# Patient Record
Sex: Female | Born: 1965 | Race: White | Hispanic: No | Smoking: Former smoker
Health system: Southern US, Community
[De-identification: ages and names within clinical notes are randomized; demographics above are authoritative.]

## PROBLEM LIST (undated history)

## (undated) DIAGNOSIS — N809 Endometriosis, unspecified: Secondary | ICD-10-CM

## (undated) DIAGNOSIS — T8859XA Other complications of anesthesia, initial encounter: Secondary | ICD-10-CM

## (undated) DIAGNOSIS — G43909 Migraine, unspecified, not intractable, without status migrainosus: Secondary | ICD-10-CM

## (undated) DIAGNOSIS — E2749 Other adrenocortical insufficiency: Secondary | ICD-10-CM

## (undated) DIAGNOSIS — Z87442 Personal history of urinary calculi: Secondary | ICD-10-CM

## (undated) DIAGNOSIS — E119 Type 2 diabetes mellitus without complications: Secondary | ICD-10-CM

## (undated) DIAGNOSIS — M792 Neuralgia and neuritis, unspecified: Secondary | ICD-10-CM

## (undated) DIAGNOSIS — T148XXA Other injury of unspecified body region, initial encounter: Secondary | ICD-10-CM

## (undated) DIAGNOSIS — E7800 Pure hypercholesterolemia, unspecified: Secondary | ICD-10-CM

## (undated) DIAGNOSIS — J189 Pneumonia, unspecified organism: Secondary | ICD-10-CM

## (undated) DIAGNOSIS — G5780 Other specified mononeuropathies of unspecified lower limb: Secondary | ICD-10-CM

## (undated) DIAGNOSIS — E785 Hyperlipidemia, unspecified: Secondary | ICD-10-CM

## (undated) DIAGNOSIS — I10 Essential (primary) hypertension: Secondary | ICD-10-CM

## (undated) DIAGNOSIS — R519 Headache, unspecified: Secondary | ICD-10-CM

## (undated) DIAGNOSIS — M5126 Other intervertebral disc displacement, lumbar region: Secondary | ICD-10-CM

## (undated) DIAGNOSIS — G4733 Obstructive sleep apnea (adult) (pediatric): Secondary | ICD-10-CM

## (undated) DIAGNOSIS — D869 Sarcoidosis, unspecified: Secondary | ICD-10-CM

## (undated) DIAGNOSIS — M1990 Unspecified osteoarthritis, unspecified site: Secondary | ICD-10-CM

## (undated) DIAGNOSIS — Z794 Long term (current) use of insulin: Secondary | ICD-10-CM

## (undated) DIAGNOSIS — Z7952 Long term (current) use of systemic steroids: Secondary | ICD-10-CM

## (undated) HISTORY — PX: NERVE EXPLORATION: SHX2082

## (undated) HISTORY — PX: CHOLECYSTECTOMY: SHX55

## (undated) HISTORY — PX: ABDOMINAL HYSTERECTOMY: SHX81

## (undated) HISTORY — PX: APPENDECTOMY: SHX54

## (undated) HISTORY — PX: CARDIAC CATHETERIZATION: SHX172

## (undated) HISTORY — PX: BACK SURGERY: SHX140

---

## 1991-08-21 HISTORY — PX: CYSTOSCOPY W/ URETERAL STENT PLACEMENT: SHX1429

## 1992-08-20 HISTORY — PX: APPENDECTOMY: SHX54

## 1994-08-20 HISTORY — PX: TOTAL ABDOMINAL HYSTERECTOMY: SHX209

## 2000-08-20 HISTORY — PX: INTRATHECAL PUMP IMPLANT: SHX6809

## 2004-05-30 ENCOUNTER — Ambulatory Visit: Payer: Self-pay | Admitting: Physician Assistant

## 2004-05-30 ENCOUNTER — Ambulatory Visit: Payer: Self-pay | Admitting: Pain Medicine

## 2004-06-02 ENCOUNTER — Ambulatory Visit: Payer: Self-pay | Admitting: Physician Assistant

## 2004-06-20 ENCOUNTER — Ambulatory Visit: Payer: Self-pay | Admitting: Physician Assistant

## 2004-07-04 ENCOUNTER — Ambulatory Visit: Payer: Self-pay | Admitting: Physician Assistant

## 2004-07-11 ENCOUNTER — Ambulatory Visit: Payer: Self-pay | Admitting: Physician Assistant

## 2004-07-17 ENCOUNTER — Ambulatory Visit: Payer: Self-pay | Admitting: Pain Medicine

## 2004-07-19 ENCOUNTER — Ambulatory Visit: Payer: Self-pay | Admitting: Physician Assistant

## 2004-09-26 ENCOUNTER — Ambulatory Visit: Payer: Self-pay | Admitting: Physician Assistant

## 2004-10-12 ENCOUNTER — Ambulatory Visit: Payer: Self-pay | Admitting: Physician Assistant

## 2004-11-10 ENCOUNTER — Ambulatory Visit: Payer: Self-pay | Admitting: Physician Assistant

## 2004-11-13 ENCOUNTER — Ambulatory Visit: Payer: Self-pay | Admitting: Physician Assistant

## 2004-11-21 ENCOUNTER — Ambulatory Visit: Payer: Self-pay | Admitting: Physician Assistant

## 2004-11-22 ENCOUNTER — Ambulatory Visit: Payer: Self-pay | Admitting: Physician Assistant

## 2004-11-27 ENCOUNTER — Ambulatory Visit: Payer: Self-pay | Admitting: Physician Assistant

## 2004-12-21 ENCOUNTER — Ambulatory Visit: Payer: Self-pay | Admitting: Physician Assistant

## 2005-01-18 ENCOUNTER — Ambulatory Visit: Payer: Self-pay | Admitting: Physician Assistant

## 2005-02-14 ENCOUNTER — Ambulatory Visit: Payer: Self-pay | Admitting: Physician Assistant

## 2005-03-19 ENCOUNTER — Ambulatory Visit: Payer: Self-pay | Admitting: Physician Assistant

## 2005-04-19 ENCOUNTER — Ambulatory Visit: Payer: Self-pay | Admitting: Physician Assistant

## 2005-05-10 ENCOUNTER — Ambulatory Visit: Payer: Self-pay | Admitting: Physician Assistant

## 2005-05-22 ENCOUNTER — Ambulatory Visit: Payer: Self-pay | Admitting: Physician Assistant

## 2005-05-24 ENCOUNTER — Ambulatory Visit: Payer: Self-pay | Admitting: Pain Medicine

## 2005-06-19 ENCOUNTER — Ambulatory Visit: Payer: Self-pay | Admitting: Physician Assistant

## 2005-07-10 ENCOUNTER — Ambulatory Visit: Payer: Self-pay | Admitting: Pain Medicine

## 2005-08-10 ENCOUNTER — Ambulatory Visit: Payer: Self-pay | Admitting: Physician Assistant

## 2005-09-13 ENCOUNTER — Ambulatory Visit: Payer: Self-pay | Admitting: Physician Assistant

## 2005-10-17 ENCOUNTER — Ambulatory Visit: Payer: Self-pay | Admitting: Physician Assistant

## 2005-10-19 ENCOUNTER — Ambulatory Visit: Payer: Self-pay | Admitting: Pain Medicine

## 2005-10-29 ENCOUNTER — Ambulatory Visit: Payer: Self-pay | Admitting: Physician Assistant

## 2005-12-24 ENCOUNTER — Ambulatory Visit: Payer: Self-pay | Admitting: Physician Assistant

## 2006-01-23 ENCOUNTER — Inpatient Hospital Stay: Payer: Self-pay | Admitting: Internal Medicine

## 2006-02-12 ENCOUNTER — Ambulatory Visit: Payer: Self-pay | Admitting: Physician Assistant

## 2006-03-06 ENCOUNTER — Ambulatory Visit: Payer: Self-pay | Admitting: Unknown Physician Specialty

## 2006-03-14 ENCOUNTER — Ambulatory Visit: Payer: Self-pay | Admitting: Unknown Physician Specialty

## 2006-04-10 ENCOUNTER — Ambulatory Visit: Payer: Self-pay | Admitting: Unknown Physician Specialty

## 2006-06-06 ENCOUNTER — Ambulatory Visit: Payer: Self-pay | Admitting: Physician Assistant

## 2006-07-16 ENCOUNTER — Ambulatory Visit: Payer: Self-pay | Admitting: Physician Assistant

## 2006-07-29 ENCOUNTER — Ambulatory Visit: Payer: Self-pay | Admitting: Physician Assistant

## 2006-09-12 ENCOUNTER — Ambulatory Visit: Payer: Self-pay | Admitting: Physician Assistant

## 2006-09-19 ENCOUNTER — Ambulatory Visit: Payer: Self-pay | Admitting: Pain Medicine

## 2006-10-08 ENCOUNTER — Ambulatory Visit: Payer: Self-pay | Admitting: Physician Assistant

## 2006-10-24 ENCOUNTER — Ambulatory Visit: Payer: Self-pay | Admitting: Physician Assistant

## 2006-10-31 ENCOUNTER — Ambulatory Visit: Payer: Self-pay | Admitting: Rheumatology

## 2006-11-26 ENCOUNTER — Ambulatory Visit: Payer: Self-pay | Admitting: Physician Assistant

## 2006-12-06 ENCOUNTER — Ambulatory Visit: Payer: Self-pay | Admitting: Rheumatology

## 2006-12-11 ENCOUNTER — Ambulatory Visit: Payer: Self-pay | Admitting: Physician Assistant

## 2006-12-14 ENCOUNTER — Other Ambulatory Visit: Payer: Self-pay

## 2006-12-14 ENCOUNTER — Inpatient Hospital Stay: Payer: Self-pay | Admitting: Internal Medicine

## 2006-12-23 ENCOUNTER — Ambulatory Visit: Payer: Self-pay | Admitting: Physician Assistant

## 2007-01-22 ENCOUNTER — Ambulatory Visit: Payer: Self-pay | Admitting: Physician Assistant

## 2007-01-24 ENCOUNTER — Other Ambulatory Visit: Payer: Self-pay

## 2007-01-24 ENCOUNTER — Ambulatory Visit: Payer: Self-pay | Admitting: General Surgery

## 2007-01-29 ENCOUNTER — Inpatient Hospital Stay: Payer: Self-pay | Admitting: General Surgery

## 2007-03-03 ENCOUNTER — Ambulatory Visit: Payer: Self-pay | Admitting: Physician Assistant

## 2007-03-31 ENCOUNTER — Ambulatory Visit: Payer: Self-pay | Admitting: Physician Assistant

## 2007-05-01 ENCOUNTER — Ambulatory Visit: Payer: Self-pay | Admitting: Physician Assistant

## 2007-05-26 ENCOUNTER — Ambulatory Visit: Payer: Self-pay | Admitting: Pain Medicine

## 2007-07-24 ENCOUNTER — Ambulatory Visit: Payer: Self-pay | Admitting: Internal Medicine

## 2007-07-29 ENCOUNTER — Ambulatory Visit: Payer: Self-pay | Admitting: Physician Assistant

## 2007-08-05 ENCOUNTER — Ambulatory Visit: Payer: Self-pay | Admitting: Physician Assistant

## 2007-08-28 ENCOUNTER — Ambulatory Visit: Payer: Self-pay | Admitting: Physician Assistant

## 2007-09-25 ENCOUNTER — Ambulatory Visit: Payer: Self-pay | Admitting: Physician Assistant

## 2007-09-30 ENCOUNTER — Ambulatory Visit: Payer: Self-pay | Admitting: Internal Medicine

## 2007-11-26 ENCOUNTER — Ambulatory Visit: Payer: Self-pay | Admitting: Physician Assistant

## 2007-12-02 ENCOUNTER — Other Ambulatory Visit: Payer: Self-pay

## 2007-12-02 ENCOUNTER — Inpatient Hospital Stay: Payer: Self-pay | Admitting: Internal Medicine

## 2007-12-23 ENCOUNTER — Emergency Department: Payer: Self-pay | Admitting: Emergency Medicine

## 2007-12-31 ENCOUNTER — Ambulatory Visit: Payer: Self-pay | Admitting: Physician Assistant

## 2008-01-14 ENCOUNTER — Ambulatory Visit: Payer: Self-pay | Admitting: Physician Assistant

## 2008-02-09 ENCOUNTER — Ambulatory Visit: Payer: Self-pay | Admitting: Pain Medicine

## 2008-02-12 ENCOUNTER — Other Ambulatory Visit: Payer: Self-pay

## 2008-02-12 ENCOUNTER — Inpatient Hospital Stay: Payer: Self-pay | Admitting: Specialist

## 2008-05-18 ENCOUNTER — Ambulatory Visit: Payer: Self-pay | Admitting: Physician Assistant

## 2008-07-21 ENCOUNTER — Inpatient Hospital Stay: Payer: Self-pay | Admitting: Internal Medicine

## 2008-09-04 ENCOUNTER — Inpatient Hospital Stay: Payer: Self-pay | Admitting: Internal Medicine

## 2008-10-07 ENCOUNTER — Ambulatory Visit: Payer: Self-pay | Admitting: Physician Assistant

## 2008-10-10 ENCOUNTER — Emergency Department: Payer: Self-pay | Admitting: Emergency Medicine

## 2009-01-27 ENCOUNTER — Ambulatory Visit: Payer: Self-pay | Admitting: Physician Assistant

## 2009-02-23 ENCOUNTER — Ambulatory Visit: Payer: Self-pay | Admitting: Physician Assistant

## 2009-06-16 ENCOUNTER — Ambulatory Visit: Payer: Self-pay | Admitting: Physician Assistant

## 2009-10-03 DIAGNOSIS — D869 Sarcoidosis, unspecified: Secondary | ICD-10-CM | POA: Insufficient documentation

## 2009-10-12 ENCOUNTER — Ambulatory Visit: Payer: Self-pay | Admitting: Pain Medicine

## 2009-12-07 IMAGING — US ABDOMEN ULTRASOUND
1 series · 17 of 25 positions shown · non-contrast
Comparison: none

REASON FOR EXAM: n/v/diarrhea, fatty liver/mild splenomegaly
COMMENTS:   LMP: Post Hysterectomy

[Series 1: abdomen ultrasound · 17 of 66 slices shown]
[im 1/66]
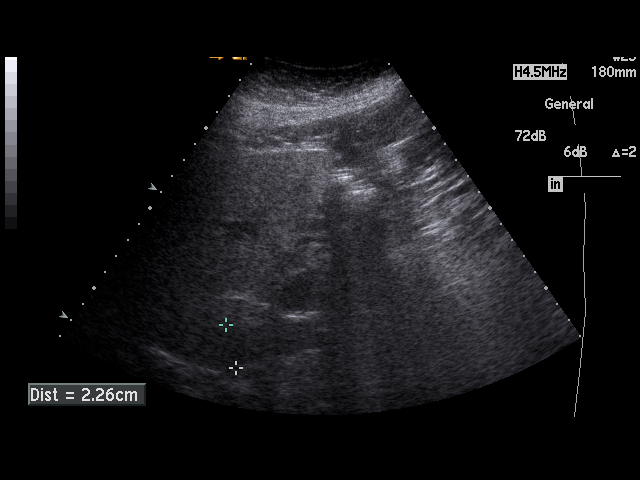
[im 6/66]
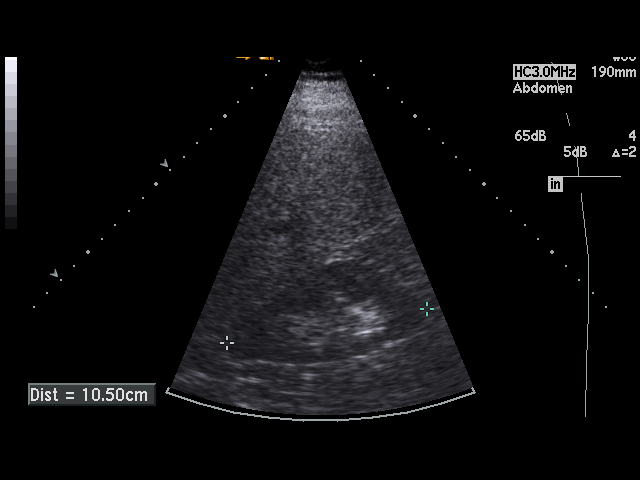
[im 9/66]
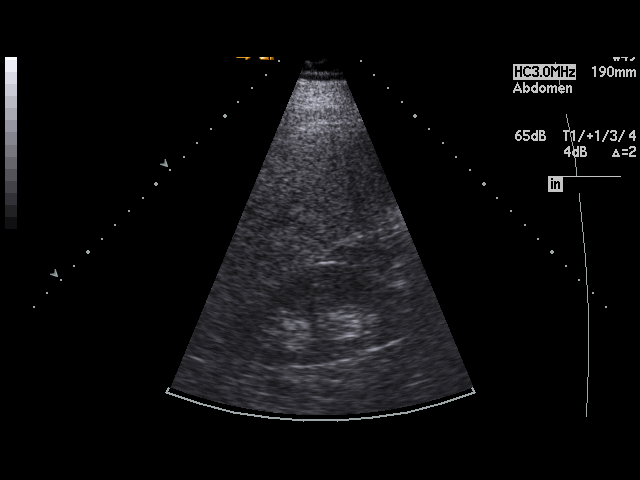
[im 14/66]
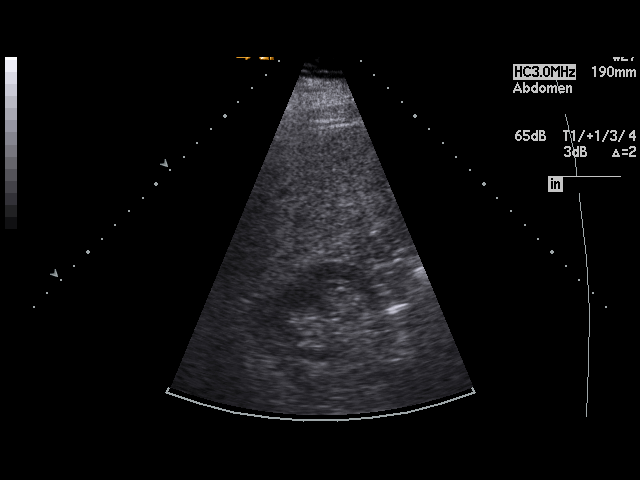
[im 17/66]
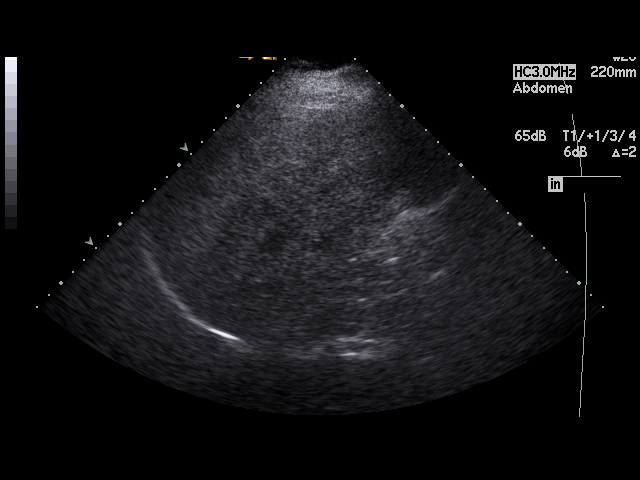
[im 22/66]
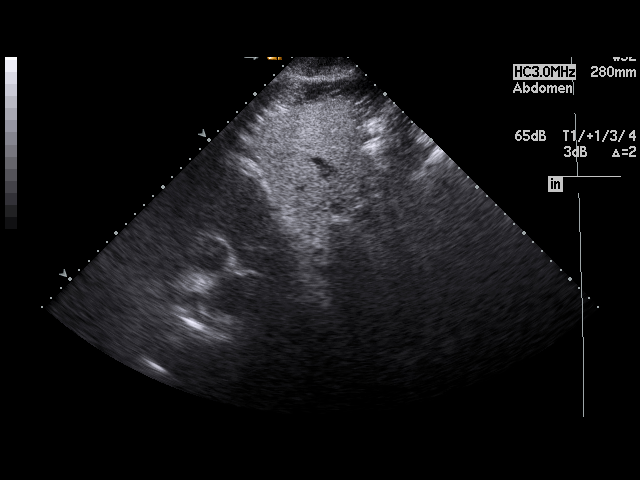
[im 25/66]
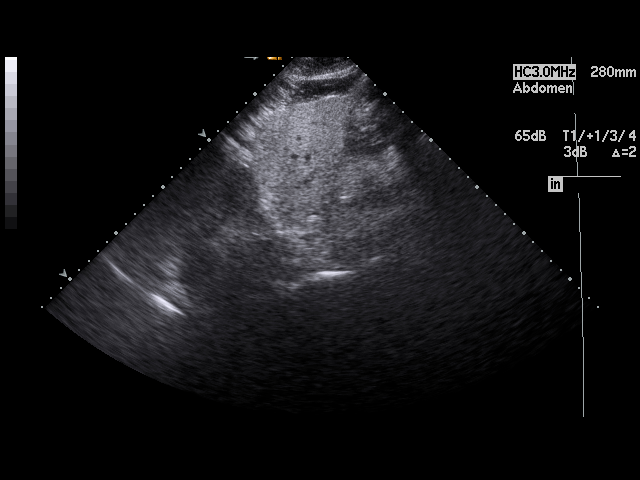
[im 30/66]
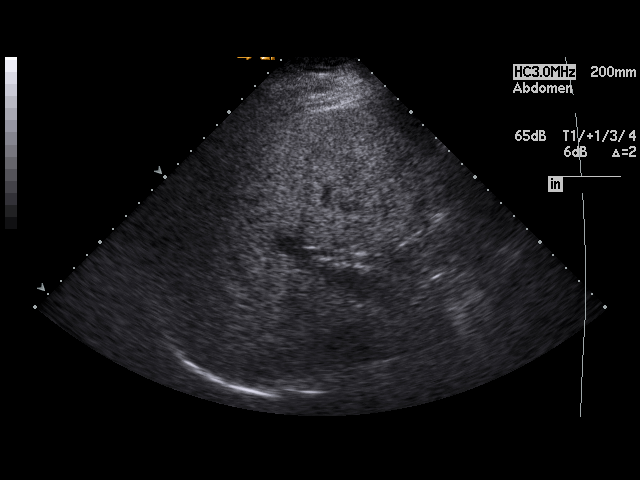
[im 33/66]
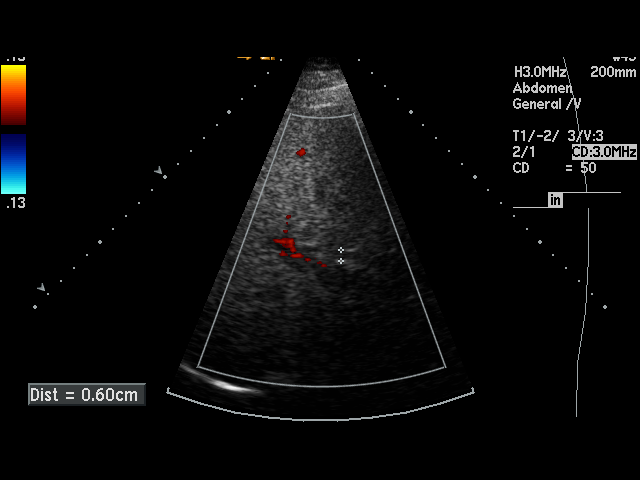
[im 36/66]
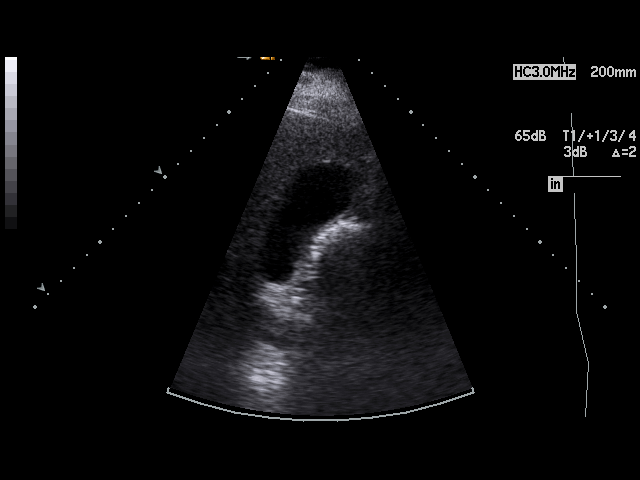
[im 41/66]
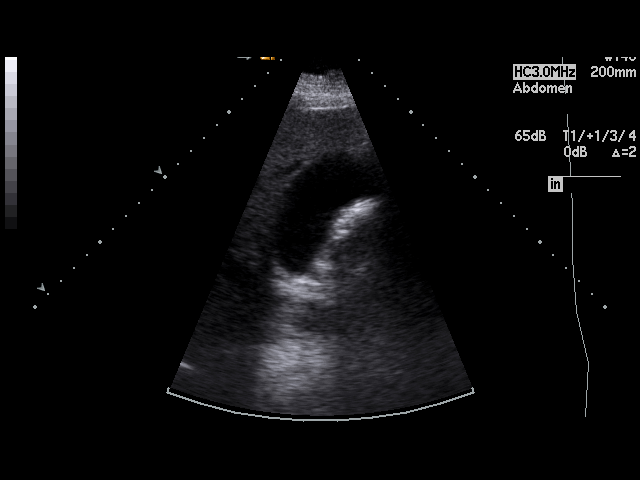
[im 44/66]
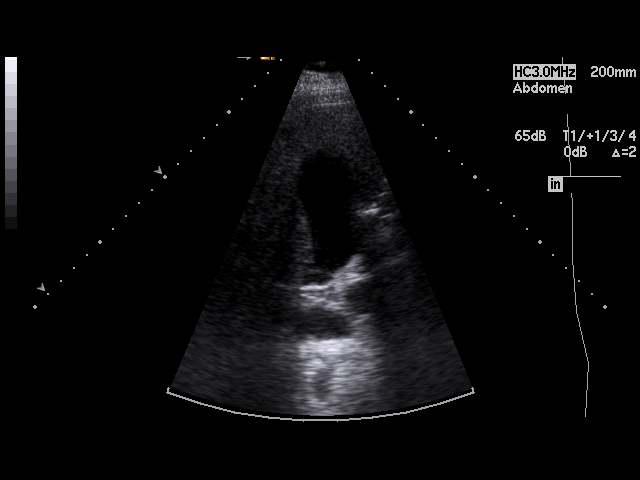
[im 49/66]
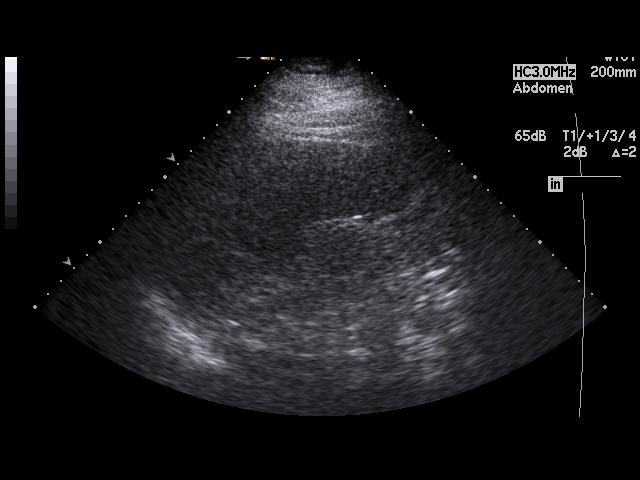
[im 52/66]
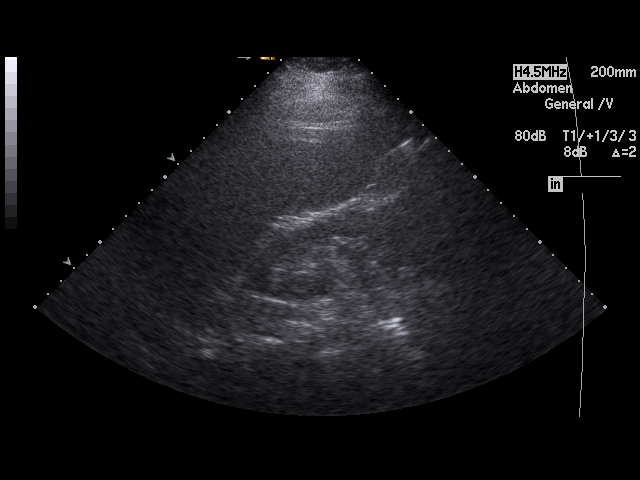
[im 57/66]
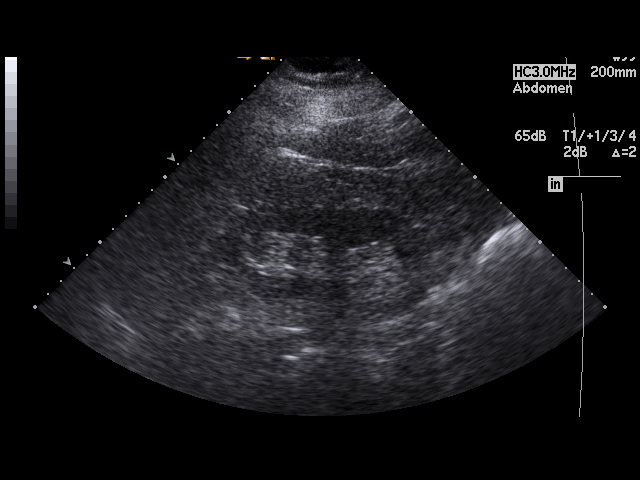
[im 60/66]
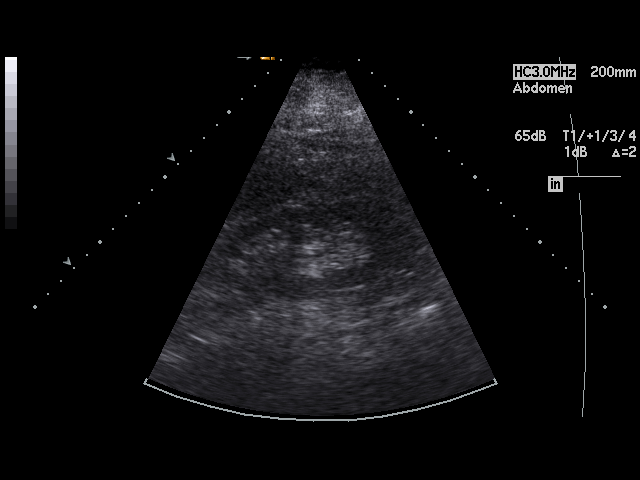
[im 66/66]
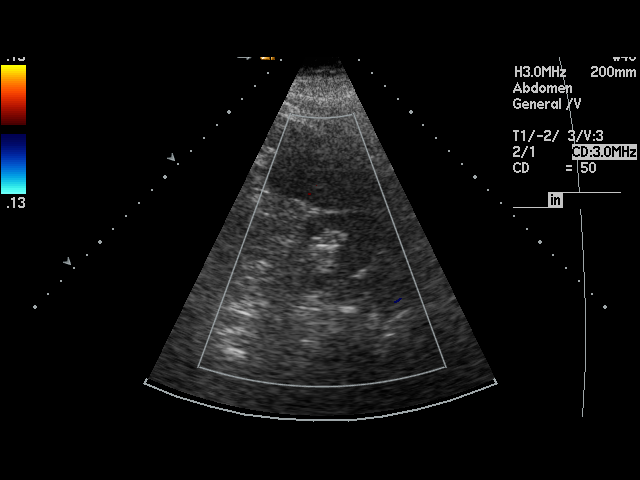

[17 of 25 positions shown; findings below may reference images not displayed]

PROCEDURE:     US  - US ABDOMEN GENERAL SURVEY  - February 13, 2008  [DATE]

RESULT:     The liver is dense, suspicious for fatty infiltration. No focal
hepatic mass lesions are seen. The pancreas is for the most part obscured by
bowel gas. The inferior vena cava and abdominal aorta also are not well seen
and are in great part obscured. The spleen is enlarged. The spleen measures
15.7 cm at maximum AP diameter. No gallstones are seen. There is no
thickening of the gallbladder wall. The common bile duct measures 4.6 mm in
diameter which is within normal limits. The kidneys show no hydronephrosis.
There is no ascites.
IMPRESSION: 1. No gallstones are seen.
2. Splenomegaly.
3. Probable fatty infiltration of the liver.
4. The pancreas is not visualized adequately for evaluation on this exam.

## 2010-01-26 ENCOUNTER — Ambulatory Visit: Payer: Self-pay | Admitting: Pain Medicine

## 2012-07-23 DIAGNOSIS — I8310 Varicose veins of unspecified lower extremity with inflammation: Secondary | ICD-10-CM | POA: Insufficient documentation

## 2013-01-14 DIAGNOSIS — M792 Neuralgia and neuritis, unspecified: Secondary | ICD-10-CM | POA: Insufficient documentation

## 2013-01-14 DIAGNOSIS — Z79891 Long term (current) use of opiate analgesic: Secondary | ICD-10-CM | POA: Insufficient documentation

## 2013-11-17 DIAGNOSIS — E2749 Other adrenocortical insufficiency: Secondary | ICD-10-CM | POA: Insufficient documentation

## 2013-12-15 HISTORY — PX: INTRATHECAL PUMP REVISION: SHX6810

## 2014-02-23 DIAGNOSIS — Z978 Presence of other specified devices: Secondary | ICD-10-CM | POA: Insufficient documentation

## 2015-03-16 DIAGNOSIS — G894 Chronic pain syndrome: Secondary | ICD-10-CM | POA: Insufficient documentation

## 2015-06-30 ENCOUNTER — Ambulatory Visit: Payer: Medicare Other | Admitting: Podiatry

## 2015-07-19 ENCOUNTER — Ambulatory Visit: Payer: Medicare Other | Admitting: Sports Medicine

## 2015-08-24 DIAGNOSIS — Z9049 Acquired absence of other specified parts of digestive tract: Secondary | ICD-10-CM | POA: Insufficient documentation

## 2015-08-24 HISTORY — PX: CHOLECYSTECTOMY, LAPAROSCOPIC: SHX56

## 2015-09-13 HISTORY — PX: COLONOSCOPY WITH ESOPHAGOGASTRODUODENOSCOPY (EGD): SHX5779

## 2015-09-18 DIAGNOSIS — K52831 Collagenous colitis: Secondary | ICD-10-CM | POA: Insufficient documentation

## 2016-05-22 DIAGNOSIS — K2980 Duodenitis without bleeding: Secondary | ICD-10-CM | POA: Insufficient documentation

## 2016-10-17 DIAGNOSIS — Z0289 Encounter for other administrative examinations: Secondary | ICD-10-CM | POA: Insufficient documentation

## 2018-07-15 ENCOUNTER — Emergency Department
Admission: EM | Admit: 2018-07-15 | Discharge: 2018-07-15 | Disposition: A | Discharge status: Home / Self Care | Payer: Medicare Other | Attending: Emergency Medicine | Admitting: Emergency Medicine

## 2018-07-15 ENCOUNTER — Other Ambulatory Visit: Payer: Self-pay

## 2018-07-15 ENCOUNTER — Encounter: Payer: Self-pay | Admitting: Emergency Medicine

## 2018-07-15 DIAGNOSIS — R05 Cough: Secondary | ICD-10-CM | POA: Diagnosis present

## 2018-07-15 DIAGNOSIS — Z7984 Long term (current) use of oral hypoglycemic drugs: Secondary | ICD-10-CM | POA: Insufficient documentation

## 2018-07-15 DIAGNOSIS — B9789 Other viral agents as the cause of diseases classified elsewhere: Secondary | ICD-10-CM | POA: Insufficient documentation

## 2018-07-15 DIAGNOSIS — E119 Type 2 diabetes mellitus without complications: Secondary | ICD-10-CM | POA: Insufficient documentation

## 2018-07-15 DIAGNOSIS — J069 Acute upper respiratory infection, unspecified: Secondary | ICD-10-CM | POA: Diagnosis not present

## 2018-07-15 DIAGNOSIS — Z79899 Other long term (current) drug therapy: Secondary | ICD-10-CM | POA: Diagnosis not present

## 2018-07-15 DIAGNOSIS — Z87891 Personal history of nicotine dependence: Secondary | ICD-10-CM | POA: Insufficient documentation

## 2018-07-15 DIAGNOSIS — I10 Essential (primary) hypertension: Secondary | ICD-10-CM | POA: Diagnosis not present

## 2018-07-15 HISTORY — DX: Type 2 diabetes mellitus without complications: E11.9

## 2018-07-15 HISTORY — DX: Pure hypercholesterolemia, unspecified: E78.00

## 2018-07-15 HISTORY — DX: Neuralgia and neuritis, unspecified: M79.2

## 2018-07-15 HISTORY — DX: Essential (primary) hypertension: I10

## 2018-07-15 MED ORDER — IPRATROPIUM-ALBUTEROL 0.5-2.5 (3) MG/3ML IN SOLN
RESPIRATORY_TRACT | Status: AC
  Administered 2018-07-15: 3 mL via RESPIRATORY_TRACT
  Filled 2018-07-15: qty 3

## 2018-07-15 MED ORDER — IPRATROPIUM-ALBUTEROL 0.5-2.5 (3) MG/3ML IN SOLN
3.0000 mL | Freq: Once | RESPIRATORY_TRACT | Status: AC
  Administered 2018-07-15: 3 mL via RESPIRATORY_TRACT

## 2018-07-15 MED ORDER — PSEUDOEPH-BROMPHEN-DM 30-2-10 MG/5ML PO SYRP: 5.0000 mL | ORAL_SOLUTION | Freq: Four times a day (QID) | ORAL | 0 refills | Status: DC | PRN

## 2018-07-15 NOTE — ED Triage Notes (Signed)
Presents with cough  States she developed cough several days ago  Eased up but returned yesterday   No fever

## 2018-07-15 NOTE — ED Notes (Addendum)
FIRST NURSE NOTE:  Pt states she is here with daughter, requesting to be seen for cough.  No distress noted on arrival.  Pt states the cough has been present for over 1 week.  Pt states cough got worse last night when sleeping.

## 2018-07-15 NOTE — Discharge Instructions (Addendum)
Follow-up with your primary care provider if any continued problems or [facility] acute [facility].  Discontinue taking Mucinex and begin taking Bromfed-DM as needed for cough and congestion.  Increase fluids.

## 2018-07-15 NOTE — ED Provider Notes (Signed)
[facility] Emergency Department Provider Note   ____________________________________________   First MD Initiated Contact with Patient 07/15/18 1226     (approximate)  I have reviewed the triage vital signs and the nursing notes.   HISTORY  Chief Complaint Cough   HPI Brittany Morris is a 52 y.o. female presents to the emergency department while her daughter is being seen for something unrelated with a cough for 1 to 2 weeks.  States that she is concerned because she has family members whose children have croup.  Patient states that she has been taking Mucinex DM for the cough with some improvement.  Patient is a former smoker but stopped 12 years ago.  She denies any difficulty breathing and currently is afebrile.   Past Medical History:  Diagnosis Date  . Diabetes mellitus without complication (HCC)   . High cholesterol   . Hypertension   . Nerve pain     There are no active problems to display for this patient.   Past Surgical History:  Procedure Laterality Date  . ABDOMINAL HYSTERECTOMY      Prior to Admission medications   Medication Sig Start Date End Date Taking? Authorizing Provider  BACLOFEN PO Take 10 mg by mouth.   Yes [provider]  budesonide (ENTOCORT EC) 3 MG 24 hr capsule Take 6 mg by mouth daily.   Yes [provider]  Dulaglutide (TRULICITY [place]) Inject into the skin.   Yes [provider]  furosemide (LASIX) 20 MG tablet Take 20 mg by mouth.   Yes [provider]  gabapentin (NEURONTIN) 300 MG capsule Take 900 mg by mouth 2 (two) times daily.   Yes [provider]  lisinopril (PRINIVIL,ZESTRIL) 10 MG tablet Take 10 mg by mouth daily.   Yes [provider]  pantoprazole (PROTONIX) 40 MG tablet Take 40 mg by mouth daily.   Yes [provider]  simvastatin (ZOCOR) 40 MG tablet Take 40 mg by mouth daily.   Yes [provider]    brompheniramine-pseudoephedrine-DM 30-2-10 MG/5ML syrup Take 5 mLs by mouth 4 (four) times daily as needed. 07/15/18   Tommi RumpsSummers, Zuha Dejonge L, PA-C    Allergies Ciprofloxacin  No family history on file.  Social History Social History   Tobacco Use  . Smoking status: Former Games developermoker  . Smokeless tobacco: Never Used  Substance Use Topics  . Alcohol use: Not Currently  . Drug use: Not on file    Review of Systems Constitutional: No fever/chills Eyes: No visual changes. ENT: No sore throat.  Negative for nasal congestion. Cardiovascular: Denies chest pain. Respiratory: Denies shortness of breath.  Positive cough. Gastrointestinal: No abdominal pain.  No nausea, no vomiting.  No diarrhea.  Genitourinary: Negative for dysuria. Musculoskeletal: Negative for muscle aches. Skin: Negative for rash. Neurological: Negative for headaches, focal weakness or numbness. ____________________________________________   PHYSICAL EXAM:  VITAL SIGNS: ED Triage Vitals  Enc Vitals Group     BP 07/15/18 1217 127/79     Pulse Rate 07/15/18 1217 88     Resp 07/15/18 1217 15     Temp 07/15/18 1217 98.2 F (36.8 C)     Temp Source 07/15/18 1217 Oral     SpO2 07/15/18 1217 99 %     Weight 07/15/18 1215 235 lb (106.6 kg)     Height 07/15/18 1215 5\' 6"  (1.676 m)     Head Circumference --      Peak Flow --  Pain Score 07/15/18 1215 0     Pain Loc --      Pain Edu? --      Excl. in GC? --    Constitutional: Alert and oriented. Well appearing and in no acute distress. Eyes: Conjunctivae are normal. PERRL. EOMI. Head: Atraumatic. Nose: No congestion/rhinnorhea.  EACs are clear.  TMs are dull but without erythema or injection. Mouth/Throat: Mucous membranes are moist.  Oropharynx non-erythematous.  Posterior drainage noted. Neck: No stridor.   Hematological/Lymphatic/Immunilogical: No cervical lymphadenopathy. Cardiovascular: Normal rate, regular rhythm. Grossly normal heart sounds.  Good  peripheral circulation. Respiratory: Normal respiratory effort.  No retractions. Lungs CTAB. Musculoskeletal: Moves upper and lower extremities without any difficulty normal gait was noted. Neurologic:  Normal speech and language. No gross focal neurologic deficits are appreciated. No gait instability. Skin:  Skin is warm, dry and intact. No rash noted. Psychiatric: Mood and affect are normal. Speech and behavior are normal.  ____________________________________________   LABS (all labs ordered are listed, but only abnormal results are displayed)  Labs Reviewed - No data to display  PROCEDURES  Procedure(s) performed: None  Procedures  Critical Care performed: No  ____________________________________________   INITIAL IMPRESSION / ASSESSMENT AND PLAN / ED COURSE  As part of my medical decision making, I reviewed the following data within the electronic MEDICAL RECORD NUMBER Notes from prior ED visits and [place] Controlled Substance Database  She presents to the ED with complaint of cough for 1 to 2 weeks.  Patient is a former smoker.  She denies any respiratory difficulties.  Exam was reassuring for a viral URI.  Patient was given a DuoNeb while in the emergency department which helped with her cough.  She states that she is used an inhaler in the past.  She was given a prescription for Bromfed-DM and told to discontinue taking the Mucinex at this time.  She is to follow-up with her PCP if any continued problems or [facility] acute care.  ____________________________________________   FINAL CLINICAL IMPRESSION(S) / ED DIAGNOSES  Final diagnoses:  Viral URI with cough     ED Discharge Orders         Ordered    brompheniramine-pseudoephedrine-DM 30-2-10 MG/5ML syrup  4 times daily PRN     07/15/18 1253           Note:  This document was prepared using Dragon voice recognition software and may include unintentional dictation errors.    Tommi Rumps, PA-C 07/15/18  1527    Governor Rooks, MD 07/15/18 [phone]

## 2019-09-24 DIAGNOSIS — M48062 Spinal stenosis, lumbar region with neurogenic claudication: Secondary | ICD-10-CM | POA: Insufficient documentation

## 2019-10-07 HISTORY — PX: INTRATHECAL PUMP REVISION: SHX6810

## 2019-10-07 HISTORY — PX: LUMBAR MICRODISCECTOMY: SHX99

## 2019-11-29 ENCOUNTER — Inpatient Hospital Stay
Admission: EM | Admit: 2019-11-29 | Discharge: 2019-12-03 | DRG: 177 | Disposition: A | Discharge status: Home Health | Payer: Medicare Other | Attending: Internal Medicine | Admitting: Internal Medicine

## 2019-11-29 ENCOUNTER — Encounter: Payer: Self-pay | Admitting: Emergency Medicine

## 2019-11-29 ENCOUNTER — Emergency Department: Payer: Medicare Other

## 2019-11-29 ENCOUNTER — Other Ambulatory Visit: Payer: Self-pay

## 2019-11-29 DIAGNOSIS — R531 Weakness: Secondary | ICD-10-CM | POA: Diagnosis present

## 2019-11-29 DIAGNOSIS — J1282 Pneumonia due to coronavirus disease 2019: Secondary | ICD-10-CM | POA: Diagnosis present

## 2019-11-29 DIAGNOSIS — E785 Hyperlipidemia, unspecified: Secondary | ICD-10-CM | POA: Diagnosis present

## 2019-11-29 DIAGNOSIS — E119 Type 2 diabetes mellitus without complications: Secondary | ICD-10-CM

## 2019-11-29 DIAGNOSIS — Z79899 Other long term (current) drug therapy: Secondary | ICD-10-CM | POA: Diagnosis not present

## 2019-11-29 DIAGNOSIS — F329 Major depressive disorder, single episode, unspecified: Secondary | ICD-10-CM | POA: Diagnosis present

## 2019-11-29 DIAGNOSIS — Z794 Long term (current) use of insulin: Secondary | ICD-10-CM | POA: Diagnosis not present

## 2019-11-29 DIAGNOSIS — Z8249 Family history of ischemic heart disease and other diseases of the circulatory system: Secondary | ICD-10-CM | POA: Diagnosis not present

## 2019-11-29 DIAGNOSIS — J9601 Acute respiratory failure with hypoxia: Secondary | ICD-10-CM | POA: Diagnosis present

## 2019-11-29 DIAGNOSIS — R197 Diarrhea, unspecified: Secondary | ICD-10-CM

## 2019-11-29 DIAGNOSIS — Z881 Allergy status to other antibiotic agents status: Secondary | ICD-10-CM

## 2019-11-29 DIAGNOSIS — Z87891 Personal history of nicotine dependence: Secondary | ICD-10-CM | POA: Diagnosis not present

## 2019-11-29 DIAGNOSIS — E871 Hypo-osmolality and hyponatremia: Secondary | ICD-10-CM | POA: Diagnosis present

## 2019-11-29 DIAGNOSIS — Z90710 Acquired absence of both cervix and uterus: Secondary | ICD-10-CM

## 2019-11-29 DIAGNOSIS — K219 Gastro-esophageal reflux disease without esophagitis: Secondary | ICD-10-CM | POA: Diagnosis present

## 2019-11-29 DIAGNOSIS — I10 Essential (primary) hypertension: Secondary | ICD-10-CM | POA: Diagnosis present

## 2019-11-29 DIAGNOSIS — E7800 Pure hypercholesterolemia, unspecified: Secondary | ICD-10-CM | POA: Diagnosis present

## 2019-11-29 DIAGNOSIS — E1165 Type 2 diabetes mellitus with hyperglycemia: Secondary | ICD-10-CM | POA: Diagnosis present

## 2019-11-29 DIAGNOSIS — R112 Nausea with vomiting, unspecified: Secondary | ICD-10-CM | POA: Diagnosis not present

## 2019-11-29 DIAGNOSIS — U071 COVID-19: Secondary | ICD-10-CM | POA: Diagnosis present

## 2019-11-29 LAB — HEPATITIS B SURFACE ANTIGEN: Hepatitis B Surface Ag: NONREACTIVE

## 2019-11-29 LAB — GLUCOSE, CAPILLARY
Glucose-Capillary: 301 mg/dL — ABNORMAL HIGH (ref 70–99)
Glucose-Capillary: 334 mg/dL — ABNORMAL HIGH (ref 70–99)
Glucose-Capillary: 447 mg/dL — ABNORMAL HIGH (ref 70–99)

## 2019-11-29 LAB — BASIC METABOLIC PANEL
Anion gap: 10 (ref 5–15)
Anion gap: 13 (ref 5–15)
BUN: 11 mg/dL (ref 6–20)
BUN: 14 mg/dL (ref 6–20)
CO2: 22 mmol/L (ref 22–32)
CO2: 19 mmol/L — ABNORMAL LOW (ref 22–32)
Calcium: 8.2 mg/dL — ABNORMAL LOW (ref 8.9–10.3)
Calcium: 8.4 mg/dL — ABNORMAL LOW (ref 8.9–10.3)
Chloride: 99 mmol/L (ref 98–111)
Chloride: 98 mmol/L (ref 98–111)
Creatinine, Ser: 0.72 mg/dL (ref 0.44–1.00)
Creatinine, Ser: 0.6 mg/dL (ref 0.44–1.00)
GFR calc Af Amer: >60 mL/min (ref >60)
GFR calc Af Amer: >60 mL/min (ref >60)
GFR calc non Af Amer: >60 mL/min (ref >60)
GFR calc non Af Amer: >60 mL/min (ref >60)
Glucose, Bld: 313 mg/dL — ABNORMAL HIGH (ref 70–99)
Glucose, Bld: 445 mg/dL — ABNORMAL HIGH (ref 70–99)
Potassium: 4 mmol/L (ref 3.5–5.1)
Potassium: 4.2 mmol/L (ref 3.5–5.1)
Sodium: 131 mmol/L — ABNORMAL LOW (ref 135–145)
Sodium: 130 mmol/L — ABNORMAL LOW (ref 135–145)

## 2019-11-29 LAB — CBC WITH DIFFERENTIAL/PLATELET
Abs Immature Granulocytes: 0.01 10*3/uL (ref 0.00–0.07)
Basophils Absolute: 0 10*3/uL (ref 0.0–0.1)
Basophils Relative: 0 %
Eosinophils Absolute: 0 10*3/uL (ref 0.0–0.5)
Eosinophils Relative: 0 %
HCT: 43.6 % (ref 36.0–46.0)
Hemoglobin: 14.3 g/dL (ref 12.0–15.0)
Immature Granulocytes: 0 %
Lymphocytes Relative: 11 %
Lymphs Abs: 0.5 10*3/uL — ABNORMAL LOW (ref 0.7–4.0)
MCH: 28.1 pg (ref 26.0–34.0)
MCHC: 32.8 g/dL (ref 30.0–36.0)
MCV: 85.8 fL (ref 80.0–100.0)
Monocytes Absolute: 0.3 10*3/uL (ref 0.1–1.0)
Monocytes Relative: 6 %
Neutro Abs: 3.7 10*3/uL (ref 1.7–7.7)
Neutrophils Relative %: 83 %
Platelets: 142 10*3/uL — ABNORMAL LOW (ref 150–400)
RBC: 5.08 MIL/uL (ref 3.87–5.11)
RDW: 12.9 % (ref 11.5–15.5)
WBC: 4.5 10*3/uL (ref 4.0–10.5)
nRBC: 0 % (ref 0.0–0.2)

## 2019-11-29 LAB — COMPREHENSIVE METABOLIC PANEL
ALT: 11 U/L (ref 0–44)
AST: 17 U/L (ref 15–41)
Albumin: 3.5 g/dL (ref 3.5–5.0)
Alkaline Phosphatase: 62 U/L (ref 38–126)
Anion gap: 13 (ref 5–15)
BUN: 12 mg/dL (ref 6–20)
CO2: 21 mmol/L — ABNORMAL LOW (ref 22–32)
Calcium: 8.3 mg/dL — ABNORMAL LOW (ref 8.9–10.3)
Chloride: 93 mmol/L — ABNORMAL LOW (ref 98–111)
Creatinine, Ser: 0.68 mg/dL (ref 0.44–1.00)
GFR calc Af Amer: >60 mL/min (ref >60)
GFR calc non Af Amer: >60 mL/min (ref >60)
Glucose, Bld: 326 mg/dL — ABNORMAL HIGH (ref 70–99)
Potassium: 3.9 mmol/L (ref 3.5–5.1)
Sodium: 127 mmol/L — ABNORMAL LOW (ref 135–145)
Total Bilirubin: 0.9 mg/dL (ref 0.3–1.2)
Total Protein: 7.1 g/dL (ref 6.5–8.1)

## 2019-11-29 LAB — POC SARS CORONAVIRUS 2 AG: SARS Coronavirus 2 Ag: POSITIVE — AB

## 2019-11-29 LAB — FERRITIN: Ferritin: 271 ng/mL (ref 11–307)

## 2019-11-29 LAB — TROPONIN I (HIGH SENSITIVITY)
Troponin I (High Sensitivity): 6 ng/L (ref <18)
Troponin I (High Sensitivity): 4 ng/L (ref <18)

## 2019-11-29 LAB — FIBRINOGEN: Fibrinogen: >750 mg/dL — ABNORMAL HIGH (ref 210–475)

## 2019-11-29 LAB — FIBRIN DERIVATIVES D-DIMER (ARMC ONLY): Fibrin derivatives D-dimer (ARMC): 1243.26 ng/mL (FEU) — ABNORMAL HIGH (ref 0.00–499.00)

## 2019-11-29 LAB — PROCALCITONIN: Procalcitonin: 0.11 ng/mL

## 2019-11-29 LAB — BRAIN NATRIURETIC PEPTIDE: B Natriuretic Peptide: 21 pg/mL (ref 0.0–100.0)

## 2019-11-29 LAB — OSMOLALITY, URINE: Osmolality, Ur: 337 mOsm/kg (ref 300–900)

## 2019-11-29 LAB — OSMOLALITY: Osmolality: 289 mOsm/kg (ref 275–295)

## 2019-11-29 LAB — LACTATE DEHYDROGENASE: LDH: 220 U/L — ABNORMAL HIGH (ref 98–192)

## 2019-11-29 LAB — HIV ANTIBODY (ROUTINE TESTING W REFLEX): HIV Screen 4th Generation wRfx: NONREACTIVE

## 2019-11-29 LAB — LACTIC ACID, PLASMA: Lactic Acid, Venous: 1.3 mmol/L (ref 0.5–1.9)

## 2019-11-29 LAB — SODIUM, URINE, RANDOM: Sodium, Ur: <10 mmol/L

## 2019-11-29 LAB — TRIGLYCERIDES: Triglycerides: 101 mg/dL (ref <150)

## 2019-11-29 LAB — C-REACTIVE PROTEIN: CRP: 11.3 mg/dL — ABNORMAL HIGH (ref <1.0)

## 2019-11-29 MED ORDER — OXYCODONE HCL 5 MG PO TABS: 5.0000 mg | ORAL_TABLET | Freq: Four times a day (QID) | ORAL | Status: DC | PRN

## 2019-11-29 MED ORDER — MORPHINE SULFATE 15 MG PO TABS
15.0000 mg | ORAL_TABLET | ORAL | Status: DC | PRN
  Administered 2019-11-29 – 2019-12-03 (×6): 15 mg via ORAL
  Filled 2019-11-29 (×6): qty 1

## 2019-11-29 MED ORDER — GABAPENTIN 300 MG PO CAPS
900.0000 mg | ORAL_CAPSULE | Freq: Two times a day (BID) | ORAL | Status: DC
  Administered 2019-11-29 – 2019-12-03 (×8): 900 mg via ORAL
  Filled 2019-11-29 (×8): qty 3

## 2019-11-29 MED ORDER — SODIUM CHLORIDE 0.9 % IV SOLN
100.0000 mg | Freq: Every day | INTRAVENOUS | Status: AC
  Administered 2019-11-30 – 2019-12-03 (×4): 100 mg via INTRAVENOUS
  Filled 2019-11-29: qty 100
  Filled 2019-11-29 (×2): qty 20
  Filled 2019-11-29: qty 100

## 2019-11-29 MED ORDER — INSULIN ASPART 100 UNIT/ML ~~LOC~~ SOLN
10.0000 [IU] | Freq: Once | SUBCUTANEOUS | Status: AC
  Administered 2019-11-29: 23:00:00 10 [IU] via SUBCUTANEOUS
  Filled 2019-11-29: qty 1

## 2019-11-29 MED ORDER — SODIUM CHLORIDE 0.9 % IV SOLN
200.0000 mg | Freq: Once | INTRAVENOUS | Status: AC
  Administered 2019-11-29: 14:00:00 200 mg via INTRAVENOUS
  Filled 2019-11-29: qty 40

## 2019-11-29 MED ORDER — ONDANSETRON HCL 4 MG/2ML IJ SOLN
4.0000 mg | Freq: Three times a day (TID) | INTRAMUSCULAR | Status: DC | PRN
  Administered 2019-11-29 – 2019-12-03 (×3): 4 mg via INTRAVENOUS
  Filled 2019-11-29 (×3): qty 2

## 2019-11-29 MED ORDER — DEXAMETHASONE SODIUM PHOSPHATE 10 MG/ML IJ SOLN
6.0000 mg | Freq: Once | INTRAMUSCULAR | Status: AC
  Administered 2019-11-29: 11:00:00 6 mg via INTRAVENOUS
  Filled 2019-11-29: qty 1

## 2019-11-29 MED ORDER — SODIUM CHLORIDE 0.9 % IV SOLN: INTRAVENOUS | Status: DC

## 2019-11-29 MED ORDER — SODIUM CHLORIDE 0.9 % IV BOLUS
500.0000 mL | Freq: Once | INTRAVENOUS | Status: AC
  Administered 2019-11-29: 10:00:00 500 mL via INTRAVENOUS

## 2019-11-29 MED ORDER — SIMVASTATIN 20 MG PO TABS
40.0000 mg | ORAL_TABLET | Freq: Every day | ORAL | Status: DC
  Administered 2019-11-29 – 2019-12-03 (×5): 40 mg via ORAL
  Filled 2019-11-29 (×5): qty 2

## 2019-11-29 MED ORDER — LISINOPRIL 10 MG PO TABS
10.0000 mg | ORAL_TABLET | Freq: Every day | ORAL | Status: DC
  Administered 2019-11-29 – 2019-11-30 (×2): 10 mg via ORAL
  Filled 2019-11-29 (×2): qty 1

## 2019-11-29 MED ORDER — TIZANIDINE HCL 4 MG PO TABS
4.0000 mg | ORAL_TABLET | Freq: Three times a day (TID) | ORAL | Status: DC
  Administered 2019-11-29 – 2019-12-03 (×10): 4 mg via ORAL
  Filled 2019-11-29 (×13): qty 1

## 2019-11-29 MED ORDER — PANTOPRAZOLE SODIUM 40 MG PO TBEC
40.0000 mg | DELAYED_RELEASE_TABLET | Freq: Every day | ORAL | Status: DC
  Administered 2019-11-29 – 2019-12-03 (×5): 40 mg via ORAL
  Filled 2019-11-29 (×5): qty 1

## 2019-11-29 MED ORDER — ASCORBIC ACID 500 MG PO TABS
500.0000 mg | ORAL_TABLET | Freq: Every day | ORAL | Status: DC
  Administered 2019-11-29 – 2019-12-03 (×5): 500 mg via ORAL
  Filled 2019-11-29 (×5): qty 1

## 2019-11-29 MED ORDER — ENOXAPARIN SODIUM 40 MG/0.4ML ~~LOC~~ SOLN
40.0000 mg | Freq: Two times a day (BID) | SUBCUTANEOUS | Status: DC
  Administered 2019-11-29 – 2019-12-03 (×7): 40 mg via SUBCUTANEOUS
  Filled 2019-11-29 (×8): qty 0.4

## 2019-11-29 MED ORDER — INSULIN ASPART 100 UNIT/ML ~~LOC~~ SOLN
6.0000 [IU] | Freq: Once | SUBCUTANEOUS | Status: AC
  Administered 2019-11-29: 11:00:00 6 [IU] via INTRAVENOUS
  Filled 2019-11-29: qty 1

## 2019-11-29 MED ORDER — HYDRALAZINE HCL 20 MG/ML IJ SOLN
5.0000 mg | INTRAMUSCULAR | Status: DC | PRN
  Administered 2019-12-01: 5 mg via INTRAVENOUS
  Filled 2019-11-29: qty 1

## 2019-11-29 MED ORDER — IPRATROPIUM BROMIDE HFA 17 MCG/ACT IN AERS
2.0000 | INHALATION_SPRAY | RESPIRATORY_TRACT | Status: DC
  Administered 2019-11-29 – 2019-12-03 (×22): 2 via RESPIRATORY_TRACT
  Filled 2019-11-29 (×2): qty 12.9

## 2019-11-29 MED ORDER — ONDANSETRON HCL 4 MG/2ML IJ SOLN
4.0000 mg | Freq: Once | INTRAMUSCULAR | Status: AC
  Administered 2019-11-29: 10:00:00 4 mg via INTRAVENOUS
  Filled 2019-11-29: qty 2

## 2019-11-29 MED ORDER — DULOXETINE HCL 30 MG PO CPEP
60.0000 mg | ORAL_CAPSULE | Freq: Two times a day (BID) | ORAL | Status: DC
  Administered 2019-11-29 – 2019-12-03 (×8): 60 mg via ORAL
  Filled 2019-11-29 (×8): qty 2

## 2019-11-29 MED ORDER — INSULIN ASPART 100 UNIT/ML ~~LOC~~ SOLN
0.0000 [IU] | Freq: Three times a day (TID) | SUBCUTANEOUS | Status: DC
  Administered 2019-11-29 (×2): 7 [IU] via SUBCUTANEOUS
  Administered 2019-11-30 (×2): 9 [IU] via SUBCUTANEOUS
  Filled 2019-11-29 (×3): qty 1

## 2019-11-29 MED ORDER — ALBUTEROL SULFATE HFA 108 (90 BASE) MCG/ACT IN AERS
2.0000 | INHALATION_SPRAY | RESPIRATORY_TRACT | Status: DC | PRN
  Filled 2019-11-29: qty 6.7

## 2019-11-29 MED ORDER — DM-GUAIFENESIN ER 30-600 MG PO TB12
1.0000 | ORAL_TABLET | Freq: Two times a day (BID) | ORAL | Status: DC
  Administered 2019-11-29 – 2019-12-03 (×9): 1 via ORAL
  Filled 2019-11-29 (×9): qty 1

## 2019-11-29 MED ORDER — ZINC SULFATE 220 (50 ZN) MG PO CAPS
220.0000 mg | ORAL_CAPSULE | Freq: Every day | ORAL | Status: DC
  Administered 2019-11-29 – 2019-12-03 (×5): 220 mg via ORAL
  Filled 2019-11-29 (×5): qty 1

## 2019-11-29 MED ORDER — ACETAMINOPHEN 325 MG PO TABS: 650.0000 mg | ORAL_TABLET | Freq: Four times a day (QID) | ORAL | Status: DC | PRN

## 2019-11-29 MED ORDER — METHYLPREDNISOLONE SODIUM SUCC 125 MG IJ SOLR
60.0000 mg | Freq: Two times a day (BID) | INTRAMUSCULAR | Status: DC
  Administered 2019-11-29 – 2019-12-01 (×4): 60 mg via INTRAVENOUS
  Filled 2019-11-29 (×4): qty 2

## 2019-11-29 MED ORDER — INSULIN ASPART 100 UNIT/ML ~~LOC~~ SOLN
0.0000 [IU] | Freq: Every day | SUBCUTANEOUS | Status: DC
  Filled 2019-11-29: qty 1

## 2019-11-29 MED ORDER — ACETAMINOPHEN 500 MG PO TABS
1000.0000 mg | ORAL_TABLET | Freq: Once | ORAL | Status: AC
  Administered 2019-11-29: 1000 mg via ORAL
  Filled 2019-11-29: qty 2

## 2019-11-29 MED ORDER — ZONISAMIDE 100 MG PO CAPS
100.0000 mg | ORAL_CAPSULE | Freq: Every day | ORAL | Status: DC
  Administered 2019-11-29 – 2019-12-03 (×5): 100 mg via ORAL
  Filled 2019-11-29 (×5): qty 1

## 2019-11-29 NOTE — ED Notes (Signed)
Pt arrived ant 88% on RA. Pt placed on 2L [place] on arrival. Pt O2 decreased to 89% on 2L [place] and flow rate increased to 3L [place]. O2 currently 94%.

## 2019-11-29 NOTE — Consult Note (Signed)
Remdesivir - Pharmacy Brief Note   No hx of remdesivir previous administration found.  A/P:  Remdesivir 200 mg IVPB once followed by 100 mg IVPB daily x 4 days.    Albina Billet, PharmD, BCPS Clinical Pharmacist 11/29/2019 11:24 AM

## 2019-11-29 NOTE — Progress Notes (Signed)
PHARMACIST - PHYSICIAN COMMUNICATION  CONCERNING:  Enoxaparin (Lovenox) for DVT Prophylaxis    RECOMMENDATION: Patient was prescribed enoxaprin 40mg  q24 hours for VTE prophylaxis.   Filed Weights   11/29/19 0930  Weight: 104.3 kg (230 lb)    Body mass index is 40.74 kg/m.  Estimated Creatinine Clearance: 95.1 mL/min (by C-G formula based on SCr of 0.72 mg/dL).   Based on [facility] policy patient is candidate for enoxaparin 40mg  every 12 hour dosing due to BMI being >40.  DESCRIPTION: Pharmacy has adjusted enoxaparin dose per [facility] policy.  Patient is now receiving enoxaparin 40mg  every 12 hours.    Brittany Morris, PharmD Clinical Pharmacist  11/29/2019 3:17 PM

## 2019-11-29 NOTE — H&P (Signed)
History and Physical    Brittany Morris NFA:[id number] DOB: January 28, 1966 DOA: 11/29/2019  Referring MD/NP/PA:   PCP: System, Pcp Not In   Patient coming from:  The patient is coming from home.  At baseline, pt is independent for most of ADL.        Chief Complaint: Generalized weakness, shortness breath, nausea, vomiting, diarrhea  HPI: Brittany Morris is a 54 y.o. female with medical history significant of hypertension, hyperlipidemia, diabetes mellitus, GERD, who presents with generalized weakness, shortness breath, nausea, vomiting, diarrhea.  Patient states that she had a positive COVID-19 test on Thursday, and has not been feeling well in the past several days.  She has mild shortness breath and mild cough. No CP. She has fever with temperature 100.1 in ED. She has generalized weakness, poor appetite, nausea, vomiting and diarrhea. No abdominal pain.  No symptoms of UTI or unilateral weakness. Oxygen desaturation to 85% on room air, which improved to 94% on 2 L nasal cannula oxygen.  ED Course: pt was found to have positive COVID-19 Ag test, WBC 4.5, lactic acid 1.3, sodium 127, renal function okay, temperature 100.1, blood pressure 142/86, tachycardia, RR 18, chest x-ray showed patchy peripheral infiltration.  Patient is admitted to MedSurg bed as inpatient.  Review of Systems:   General: has fevers, chills, no body weight gain, has poor appetite, has fatigue HEENT: no blurry vision, hearing changes or sore throat Respiratory: has dyspnea, coughing, no wheezing CV: no chest pain, no palpitations GI: has nausea, vomiting, diarrhea, no constipation, abdominal pain, GU: no dysuria, burning on urination, increased urinary frequency, hematuria  Ext: no leg edema Neuro: no unilateral weakness, numbness, or tingling, no vision change or hearing loss Skin: no rash, no skin tear. MSK: No muscle spasm, no deformity, no limitation of range of movement in spin Heme: No easy bruising.  Travel  history: No recent long distant travel.  Allergy:  Allergies  Allergen Reactions  . Ciprofloxacin Rash    Past Medical History:  Diagnosis Date  . Diabetes mellitus without complication (HCC)   . High cholesterol   . Hypertension   . Nerve pain     Past Surgical History:  Procedure Laterality Date  . ABDOMINAL HYSTERECTOMY      Social History:  reports that she has quit smoking. She has never used smokeless tobacco. She reports previous alcohol use. She reports that she does not use drugs.  Family History:  Family History  Problem Relation Age of Onset  . Heart disease Mother      Prior to Admission medications   Medication Sig Start Date End Date Taking? Authorizing Provider  BACLOFEN PO Take 10 mg by mouth.    [provider]  brompheniramine-pseudoephedrine-DM 30-2-10 MG/5ML syrup Take 5 mLs by mouth 4 (four) times daily as needed. 07/15/18   Tommi Rumps, PA-C  budesonide (ENTOCORT EC) 3 MG 24 hr capsule Take 6 mg by mouth daily.    [provider]  Dulaglutide (TRULICITY [place]) Inject into the skin.    [provider]  furosemide (LASIX) 20 MG tablet Take 20 mg by mouth.    [provider]  gabapentin (NEURONTIN) 300 MG capsule Take 900 mg by mouth 2 (two) times daily.    [provider]  lisinopril (PRINIVIL,ZESTRIL) 10 MG tablet Take 10 mg by mouth daily.    [provider]  pantoprazole (PROTONIX) 40 MG tablet Take 40 mg by mouth daily.    [provider]  simvastatin (ZOCOR) 40 MG tablet Take 40 mg by mouth daily.    [provider]    Physical Exam: Vitals:   11/29/19 1132 11/29/19 1230 11/29/19 1300 11/29/19 1330  BP:  (!) 140/95 (!) 148/93 (!) 131/91  Pulse: 96 95 88 80  Resp: 15 18 16    Temp:      TempSrc:      SpO2: 95% 93% 96% 95%  Weight:      Height:       General: Not in acute distress. Dry mucus and lumbar HEENT:       Eyes: PERRL, EOMI, no scleral icterus.       ENT:  No discharge from the ears and nose, no pharynx injection, no tonsillar enlargement.        Neck: No JVD, no bruit, no mass felt. Heme: No neck lymph node enlargement. Cardiac: S1/S2, RRR, No murmurs, No gallops or rubs. Respiratory: No rales, wheezing, rhonchi or rubs. GI: Soft, nondistended, nontender, no rebound pain, no organomegaly, BS present. GU: No hematuria Ext: No pitting leg edema bilaterally. 2+DP/PT pulse bilaterally. Musculoskeletal: No joint deformities, No joint redness or warmth, no limitation of ROM in spin. Skin: No rashes.  Neuro: Alert, oriented X3, cranial nerves II-XII grossly intact, moves all extremities normally.  Psych: Patient is not psychotic, no suicidal or hemocidal ideation.  Labs on Admission: I have personally reviewed following labs and imaging studies  CBC: Recent Labs  Lab 11/29/19 0936  WBC 4.5  NEUTROABS 3.7  HGB 14.3  HCT 43.6  MCV 85.8  PLT 299*   Basic Metabolic Panel: Recent Labs  Lab 11/29/19 0936 11/29/19 1251  NA 127* 131*  K 3.9 4.0  CL 93* 99  CO2 21* 22  GLUCOSE 326* 313*  BUN 12 11  CREATININE 0.68 0.72  CALCIUM 8.3* 8.2*   GFR: Estimated Creatinine Clearance: 95.1 mL/min (by C-G formula based on SCr of 0.72 mg/dL). Liver Function Tests: Recent Labs  Lab 11/29/19 0936  AST 17  ALT 11  ALKPHOS 62  BILITOT 0.9  PROT 7.1  ALBUMIN 3.5   No results for input(s): LIPASE, AMYLASE in the last 168 hours. No results for input(s): AMMONIA in the last 168 hours. Coagulation Profile: No results for input(s): INR, PROTIME in the last 168 hours. Cardiac Enzymes: No results for input(s): CKTOTAL, CKMB, CKMBINDEX, TROPONINI in the last 168 hours. BNP (last 3 results) No results for input(s): PROBNP in the last 8760 hours. HbA1C: No results for input(s): HGBA1C in the last 72 hours. CBG: Recent Labs  Lab 11/29/19 1334  GLUCAP 301*   Lipid Profile: Recent Labs    11/29/19 0934  TRIG 101   Thyroid Function  Tests: No results for input(s): TSH, T4TOTAL, FREET4, T3FREE, THYROIDAB in the last 72 hours. Anemia Panel: Recent Labs    11/29/19 0936  FERRITIN 271   Urine analysis: No results found for: COLORURINE, APPEARANCEUR, LABSPEC, PHURINE, GLUCOSEU, HGBUR, BILIRUBINUR, KETONESUR, PROTEINUR, UROBILINOGEN, NITRITE, LEUKOCYTESUR Sepsis Labs: @LABRCNTIP (procalcitonin:4,lacticidven:4) )No results found for this or any previous visit (from the past 240 hour(s)).   Radiological Exams on Admission: DG Chest Port 1 View  Result Date: 11/29/2019 CLINICAL DATA:  Pt presents to the ED from home for weakness, NVD, and decreased appetite. Pt was dx with COVID on Thurs and hasn't been well ever since. CBG 370. EMS V/S 100.7, 85% on RA, EMS placed on 2L [place] and increased to 94%. Pt denies SOB.*comment was truncated*covid, sepsis, eval for infiltrates EXAM:  PORTABLE CHEST 1 VIEW COMPARISON:  08/25/2008 FINDINGS: Normal cardiac silhouette. There is mild patchy bilateral airspace opacities. No pleural fluid. No pneumothorax. IMPRESSION: Peripheral patchy airspace opacities consists of early COVID pneumonia. Electronically Signed   By: Genevive Bi M.D.   On: 11/29/2019 10:14     EKG: Independently reviewed.  Sinus rhythm, tachycardia, QTC 484, LAE, LAD, low voltage.  Assessment/Plan Principal Problem:   Pneumonia due to COVID-19 virus Active Problems:   Hypertension   High cholesterol   Diabetes mellitus without complication (HCC)   Hyponatremia   Nausea vomiting and diarrhea   Acute respiratory failure with hypoxia (HCC)   Acute respiratory failure with hypoxia due to pneumonia due to COVID-19 virus: Patient has oxygen desaturation to 85% on room air.  Chest x-ray showed patchy peripheral infiltration.  -will admit to med-surg bed as inpt -Remdesivir per pharm -Solumedrol 60 mg bid -vitamin C, zinc.  -Bronchodilators -PRN Mucinex for cough -f/u Blood culture -Gentle IV fluid:  -D-dimer,  BNP,Trop, LFT, CRP, LDH, Procalcitonin, Ferritin, fibinogen, TG, Hep B SAg, HIV ab -Daily CRP, Ferritin, D-dimer, -Will ask the patient to maintain an awake prone position for 16+ hours a day, if possible, with a minimum of 2-3 hours at a time -Will attempt to maintain euvolemia to a net negative fluid status -IF patient deteriorates, will consult PCCM and ID  HTN:  -Continue home medications: Lisinopril -hold lasix due to hyponatremia -hydralazine prn  High cholesterol -zocor  Diabetes mellitus without complication (HCC): Most recent A1c not on record. Patient is taking Ozempic, Trulicity, Novolin at home -SSI -Check A1c  Hyponatremia: Most likely due to GI loss. Poor oral intake, nausea, vomiting and diarrhea.  Patient is also on Lasix - Will check urine sodium, urine osmolality, serum osmolality. - check TSH - IVF: 500 mL NS in ED, will continue with IV normal saline at 75 mL/h - f/u by BMP q8h - avoid over correction too fast due to risk of central pontine myelinolysis  Nausea vomiting and diarrhea: Most likely due to COVID-19 infection -Supportive care -As needed Zofran -IV fluid   Inpatient status:  # Patient requires inpatient status due to high intensity of service, high risk for further deterioration and high frequency of surveillance required.  I certify that at the point of admission it is my clinical judgment that the patient will require inpatient hospital care spanning beyond 2 midnights from the point of admission.   . This patient has multiple chronic comorbidities including hypertension, hyperlipidemia, diabetes mellitus, GERD . Now patient has presenting with acute respiratory failure with hypoxia due to pneumonia due to COVID-19 virus, also has hyponatremia . The worrisome physical exam findings include dry mucosal membrane . The initial radiographic and laboratory data are worrisome because of positive COVID-19 Ag test, hyponatremia . Current medical needs:  please see my assessment and plan . Predictability of an adverse outcome (risk): Patient has multiple comorbidities as listed above. . Now presents with acute respiratory failure with hypoxia due to pneumonia due to COVID-19 virus, has new oxygen requirement, also has hyponatremia. Patient's presentation is highly complicated.  Patient is at high risk of deteriorating.  Will need to be treated in hospital for at least 2 days.            DVT ppx: SQ Lovenox Code Status: Full code Family Communication: not done, no family member is at bed side.   Disposition Plan:  Anticipate discharge back to previous home environment Consults called:  none Admission  status: Med-surg bed as inpt    Date of Service 11/29/2019    Lorretta Harp [facility]   If 7PM-7AM, please contact night-coverage [email or web address] 11/29/2019, 5:41 PM

## 2019-11-29 NOTE — ED Triage Notes (Signed)
Pt presents to the ED from home for weakness, NVD, and decreased appetite. Pt was dx with COVID on Thurs and hasn't been well ever since. CBG 370. EMS V/S 100.7, 85% on RA, EMS placed on 2L [place] and increased to 94%. Pt denies SOB. Pt A&Ox4 at this time.

## 2019-11-29 NOTE — ED Provider Notes (Signed)
[facility] Emergency Department Provider Note    First MD Initiated Contact with Patient 11/29/19 [phone]     (approximate)  I have reviewed the triage vital signs and the nursing notes.   HISTORY  Chief Complaint Weakness and Emesis    HPI Brittany Morris is a 54 y.o. female with the below this past medical history with recently testing positive for Covid presents the ER for 1 week of progressively worsening generalized malaise myalgias cough congestion as well as vomiting.  She called EMS today because she could not stop throwing up feeling short of breath.  EMS found patient hypoxic in the mid 80s was placed on supplemental oxygen.  She protecting her airway.    Past Medical History:  Diagnosis Date  . Diabetes mellitus without complication (HCC)   . High cholesterol   . Hypertension   . Nerve pain    History reviewed. No pertinent family history. Past Surgical History:  Procedure Laterality Date  . ABDOMINAL HYSTERECTOMY     Patient Active Problem List   Diagnosis Date Noted  . Hypertension   . High cholesterol   . Diabetes mellitus without complication (HCC)   . Pneumonia due to COVID-19 virus   . Hyponatremia   . Nausea vomiting and diarrhea       Prior to Admission medications   Medication Sig Start Date End Date Taking? Authorizing Provider  BACLOFEN PO Take 10 mg by mouth.    [provider]  brompheniramine-pseudoephedrine-DM 30-2-10 MG/5ML syrup Take 5 mLs by mouth 4 (four) times daily as needed. 07/15/18   Tommi Rumps, PA-C  budesonide (ENTOCORT EC) 3 MG 24 hr capsule Take 6 mg by mouth daily.    [provider]  Dulaglutide (TRULICITY [place]) Inject into the skin.    [provider]  furosemide (LASIX) 20 MG tablet Take 20 mg by mouth.    [provider]  gabapentin (NEURONTIN) 300 MG capsule Take 900 mg by mouth 2 (two) times daily.    [provider]  lisinopril (PRINIVIL,ZESTRIL)  10 MG tablet Take 10 mg by mouth daily.    [provider]  pantoprazole (PROTONIX) 40 MG tablet Take 40 mg by mouth daily.    [provider]  simvastatin (ZOCOR) 40 MG tablet Take 40 mg by mouth daily.    [provider]    Allergies Ciprofloxacin    Social History Social History   Tobacco Use  . Smoking status: Former Games developer  . Smokeless tobacco: Never Used  Substance Use Topics  . Alcohol use: Not Currently  . Drug use: Never    Review of Systems Patient denies headaches, rhinorrhea, blurry vision, numbness, shortness of breath, chest pain, edema, cough, abdominal pain, nausea, vomiting, diarrhea, dysuria, fevers, rashes or hallucinations unless otherwise stated above in HPI. ____________________________________________   PHYSICAL EXAM:  VITAL SIGNS: Vitals:   11/29/19 1000 11/29/19 1030  BP: (!) 146/92 (!) 142/86  Pulse: (!) 113 (!) 104  Resp: 17 18  Temp:    SpO2: 94% 94%    Constitutional: Alert and oriented but diaphoretic and ill apearing Eyes: Conjunctivae are normal.  Head: Atraumatic. Nose: No congestion/rhinnorhea. Mouth/Throat: Mucous membranes are moist.   Neck: No stridor. Painless ROM.  Cardiovascular: tachycardic rate regular rhythm. Grossly normal heart sounds.  Good peripheral circulation. Respiratory: Normal respiratory effort.  No retractions. Lungs with scattered crackles Gastrointestinal: Soft and nontender. No distention. No abdominal bruits. No CVA tenderness. Genitourinary: deferred Musculoskeletal:  No lower extremity tenderness nor edema.  No joint effusions. Neurologic:  Normal speech and language. No gross focal neurologic deficits are appreciated. No facial droop Skin:  Skin is warm, dry and intact. No rash noted. Psychiatric: Mood and affect are normal. Speech and behavior are normal.  ____________________________________________   LABS (all labs ordered are listed, but only abnormal results are  displayed)  Results for orders placed or performed during the hospital encounter of 11/29/19 (from the past 24 hour(s))  Lactic acid, plasma     Status: None   Collection Time: 11/29/19  9:34 AM  Result Value Ref Range   Lactic Acid, Venous 1.3 0.5 - 1.9 mmol/L  Triglycerides     Status: None   Collection Time: 11/29/19  9:34 AM  Result Value Ref Range   Triglycerides 101 <150 mg/dL  CBC WITH DIFFERENTIAL     Status: Abnormal   Collection Time: 11/29/19  9:36 AM  Result Value Ref Range   WBC 4.5 4.0 - 10.5 K/uL   RBC 5.08 3.87 - 5.11 MIL/uL   Hemoglobin 14.3 12.0 - 15.0 g/dL   HCT 43.6 36.0 - 46.0 %   MCV 85.8 80.0 - 100.0 fL   MCH 28.1 26.0 - 34.0 pg   MCHC 32.8 30.0 - 36.0 g/dL   RDW 12.9 11.5 - 15.5 %   Platelets 142 (L) 150 - 400 K/uL   nRBC 0.0 0.0 - 0.2 %   Neutrophils Relative % 83 %   Neutro Abs 3.7 1.7 - 7.7 K/uL   Lymphocytes Relative 11 %   Lymphs Abs 0.5 (L) 0.7 - 4.0 K/uL   Monocytes Relative 6 %   Monocytes Absolute 0.3 0.1 - 1.0 K/uL   Eosinophils Relative 0 %   Eosinophils Absolute 0.0 0.0 - 0.5 K/uL   Basophils Relative 0 %   Basophils Absolute 0.0 0.0 - 0.1 K/uL   Immature Granulocytes 0 %   Abs Immature Granulocytes 0.01 0.00 - 0.07 K/uL  Comprehensive metabolic panel     Status: Abnormal   Collection Time: 11/29/19  9:36 AM  Result Value Ref Range   Sodium 127 (L) 135 - 145 mmol/L   Potassium 3.9 3.5 - 5.1 mmol/L   Chloride 93 (L) 98 - 111 mmol/L   CO2 21 (L) 22 - 32 mmol/L   Glucose, Bld 326 (H) 70 - 99 mg/dL   BUN 12 6 - 20 mg/dL   Creatinine, Ser 0.68 0.44 - 1.00 mg/dL   Calcium 8.3 (L) 8.9 - 10.3 mg/dL   Total Protein 7.1 6.5 - 8.1 g/dL   Albumin 3.5 3.5 - 5.0 g/dL   AST 17 15 - 41 U/L   ALT 11 0 - 44 U/L   Alkaline Phosphatase 62 38 - 126 U/L   Total Bilirubin 0.9 0.3 - 1.2 mg/dL   GFR calc non Af Amer >60 >60 mL/min   GFR calc Af Amer >60 >60 mL/min   Anion gap 13 5 - 15  Fibrin derivatives D-Dimer     Status: Abnormal   Collection  Time: 11/29/19  9:36 AM  Result Value Ref Range   Fibrin derivatives D-dimer ([facility]) 1,243.26 (H) 0.00 - 499.00 ng/mL (FEU)  Procalcitonin     Status: None   Collection Time: 11/29/19  9:36 AM  Result Value Ref Range   Procalcitonin 0.11 ng/mL  Lactate dehydrogenase     Status: Abnormal   Collection Time: 11/29/19  9:36 AM  Result Value Ref Range   LDH  220 (H) 98 - 192 U/L  Ferritin     Status: None   Collection Time: 11/29/19  9:36 AM  Result Value Ref Range   Ferritin 271 11 - 307 ng/mL  Fibrinogen     Status: Abnormal   Collection Time: 11/29/19  9:36 AM  Result Value Ref Range   Fibrinogen >750 (H) 210 - 475 mg/dL  POC SARS Coronavirus 2 Ag     Status: Abnormal   Collection Time: 11/29/19 10:39 AM  Result Value Ref Range   SARS Coronavirus 2 Ag POSITIVE (A) NEGATIVE   ____________________________________________  EKG My review and personal interpretation at Time: 9:28   Indication: sepsis  Rate: 110  Rhythm: sinus Axis: normal Other: normal intervals, no stemi ____________________________________________  RADIOLOGY  I personally reviewed all radiographic images ordered to evaluate for the above acute complaints and reviewed radiology reports and findings.  These findings were personally discussed with the patient.  Please see medical record for radiology report.  ____________________________________________   PROCEDURES  Procedure(s) performed:  .Critical Care Performed by: Willy Eddy, MD Authorized by: Willy Eddy, MD   Critical care provider statement:    Critical care time (minutes):  35   Critical care time was exclusive of:  Separately billable procedures and treating other patients   Critical care was necessary to treat or prevent imminent or life-threatening deterioration of the following conditions:  Respiratory failure   Critical care was time spent personally by me on the following activities:  Development of treatment plan with patient or  surrogate, discussions with consultants, evaluation of patient's response to treatment, examination of patient, obtaining history from patient or surrogate, ordering and performing treatments and interventions, ordering and review of laboratory studies, ordering and review of radiographic studies, pulse oximetry, re-evaluation of patient's condition and review of old charts      Critical Care performed: yes ____________________________________________   INITIAL IMPRESSION / ASSESSMENT AND PLAN / ED COURSE  Pertinent labs & imaging results that were available during my care of the patient were reviewed by me and considered in my medical decision making (see chart for details).   DDX: covid 19, sepsis, pna, chf, copd, dehydration, electrolyte abn  NOELE ICENHOUR is a 54 y.o. who presents to the ED with symptoms as described above and evidence of acute respiratory failure with hypoxia that I suspect is secondary to COVID-19 infection.  Patient will be continued on supplemental oxygen she is currently protecting her airway.  Blood will be sent for the but differential.  She is tachycardic on telemetry.  The patient will be placed on continuous pulse oximetry and telemetry for monitoring.  Laboratory evaluation will be sent to evaluate for the above complaints.     Clinical Course as of Nov 29 1135  Sun Nov 29, 2019  2376 Patient's chest x-ray does show peripheral opacities consistent with COVID-19 infection.   [PR]  1136 Blood work and presentation is consistent with COVID-19 pneumonia and infection.  Have ordered Decadron as well as IV insulin and remdesivir.  Will discuss with hospitalist   [PR]    Clinical Course User Index [PR] Willy Eddy, MD    The patient was evaluated in Emergency Department today for the symptoms described in the history of present illness. He/she was evaluated in the context of the global COVID-19 pandemic, which necessitated consideration that the patient  might be at risk for infection with the SARS-CoV-2 virus that causes COVID-19. Institutional protocols and algorithms that pertain to the  evaluation of patients at risk for COVID-19 are in a state of rapid change based on information released by regulatory bodies including the CDC and federal and state organizations. These policies and algorithms were followed during the patient's care in the ED.  As part of my medical decision making, I reviewed the following data within the electronic MEDICAL RECORD NUMBER Nursing notes reviewed and incorporated, Labs reviewed, notes from prior ED visits and [place] Controlled Substance Database   ____________________________________________   FINAL CLINICAL IMPRESSION(S) / ED DIAGNOSES  Final diagnoses:  COVID-19 virus infection      NEW MEDICATIONS STARTED DURING THIS VISIT:  New Prescriptions   No medications on file     Note:  This document was prepared using Dragon voice recognition software and may include unintentional dictation errors.    Willy Eddy, MD 11/29/19 1137

## 2019-11-30 DIAGNOSIS — U071 COVID-19: Principal | ICD-10-CM

## 2019-11-30 DIAGNOSIS — J1282 Pneumonia due to coronavirus disease 2019: Secondary | ICD-10-CM

## 2019-11-30 DIAGNOSIS — E119 Type 2 diabetes mellitus without complications: Secondary | ICD-10-CM

## 2019-11-30 DIAGNOSIS — J9601 Acute respiratory failure with hypoxia: Secondary | ICD-10-CM

## 2019-11-30 DIAGNOSIS — E7800 Pure hypercholesterolemia, unspecified: Secondary | ICD-10-CM

## 2019-11-30 LAB — CBC WITH DIFFERENTIAL/PLATELET
Abs Immature Granulocytes: 0.01 10*3/uL (ref 0.00–0.07)
Basophils Absolute: 0 10*3/uL (ref 0.0–0.1)
Basophils Relative: 0 %
Eosinophils Absolute: 0 10*3/uL (ref 0.0–0.5)
Eosinophils Relative: 0 %
HCT: 40.6 % (ref 36.0–46.0)
Hemoglobin: 13.7 g/dL (ref 12.0–15.0)
Immature Granulocytes: 0 %
Lymphocytes Relative: 17 %
Lymphs Abs: 0.5 10*3/uL — ABNORMAL LOW (ref 0.7–4.0)
MCH: 28.7 pg (ref 26.0–34.0)
MCHC: 33.7 g/dL (ref 30.0–36.0)
MCV: 84.9 fL (ref 80.0–100.0)
Monocytes Absolute: 0.1 10*3/uL (ref 0.1–1.0)
Monocytes Relative: 3 %
Neutro Abs: 2.2 10*3/uL (ref 1.7–7.7)
Neutrophils Relative %: 80 %
Platelets: 159 10*3/uL (ref 150–400)
RBC: 4.78 MIL/uL (ref 3.87–5.11)
RDW: 13.1 % (ref 11.5–15.5)
WBC: 2.7 10*3/uL — ABNORMAL LOW (ref 4.0–10.5)
nRBC: 0 % (ref 0.0–0.2)

## 2019-11-30 LAB — COMPREHENSIVE METABOLIC PANEL
ALT: 11 U/L (ref 0–44)
AST: 11 U/L — ABNORMAL LOW (ref 15–41)
Albumin: 3.3 g/dL — ABNORMAL LOW (ref 3.5–5.0)
Alkaline Phosphatase: 58 U/L (ref 38–126)
Anion gap: 9 (ref 5–15)
BUN: 15 mg/dL (ref 6–20)
CO2: 25 mmol/L (ref 22–32)
Calcium: 8.7 mg/dL — ABNORMAL LOW (ref 8.9–10.3)
Chloride: 100 mmol/L (ref 98–111)
Creatinine, Ser: 0.66 mg/dL (ref 0.44–1.00)
GFR calc Af Amer: >60 mL/min (ref >60)
GFR calc non Af Amer: >60 mL/min (ref >60)
Glucose, Bld: 349 mg/dL — ABNORMAL HIGH (ref 70–99)
Potassium: 5.1 mmol/L (ref 3.5–5.1)
Sodium: 134 mmol/L — ABNORMAL LOW (ref 135–145)
Total Bilirubin: 0.6 mg/dL (ref 0.3–1.2)
Total Protein: 6.7 g/dL (ref 6.5–8.1)

## 2019-11-30 LAB — C-REACTIVE PROTEIN: CRP: 11.7 mg/dL — ABNORMAL HIGH (ref <1.0)

## 2019-11-30 LAB — GLUCOSE, CAPILLARY
Glucose-Capillary: 373 mg/dL — ABNORMAL HIGH (ref 70–99)
Glucose-Capillary: 396 mg/dL — ABNORMAL HIGH (ref 70–99)
Glucose-Capillary: 447 mg/dL — ABNORMAL HIGH (ref 70–99)
Glucose-Capillary: 448 mg/dL — ABNORMAL HIGH (ref 70–99)

## 2019-11-30 LAB — T4, FREE: Free T4: 1.01 ng/dL (ref 0.61–1.12)

## 2019-11-30 LAB — MAGNESIUM: Magnesium: 2 mg/dL (ref 1.7–2.4)

## 2019-11-30 LAB — FIBRIN DERIVATIVES D-DIMER (ARMC ONLY): Fibrin derivatives D-dimer (ARMC): 1091.94 ng/mL (FEU) — ABNORMAL HIGH (ref 0.00–499.00)

## 2019-11-30 LAB — TSH: TSH: 0.221 u[IU]/mL — ABNORMAL LOW (ref 0.350–4.500)

## 2019-11-30 LAB — FERRITIN: Ferritin: 271 ng/mL (ref 11–307)

## 2019-11-30 MED ORDER — INSULIN ASPART 100 UNIT/ML ~~LOC~~ SOLN
SUBCUTANEOUS | Status: AC
  Administered 2019-11-30: 11:00:00 15 [IU] via SUBCUTANEOUS
  Filled 2019-11-30: qty 1

## 2019-11-30 MED ORDER — INSULIN NPH (HUMAN) (ISOPHANE) 100 UNIT/ML ~~LOC~~ SUSP: 15.0000 [IU] | Freq: Once | SUBCUTANEOUS | Status: DC

## 2019-11-30 MED ORDER — INSULIN ASPART 100 UNIT/ML ~~LOC~~ SOLN
5.0000 [IU] | Freq: Three times a day (TID) | SUBCUTANEOUS | Status: DC
  Administered 2019-11-30 – 2019-12-01 (×2): 5 [IU] via SUBCUTANEOUS
  Filled 2019-11-30 (×2): qty 1

## 2019-11-30 MED ORDER — INSULIN DETEMIR 100 UNIT/ML ~~LOC~~ SOLN
15.0000 [IU] | Freq: Two times a day (BID) | SUBCUTANEOUS | Status: DC
  Administered 2019-11-30 (×2): 15 [IU] via SUBCUTANEOUS
  Filled 2019-11-30 (×4): qty 0.15

## 2019-11-30 MED ORDER — INSULIN ASPART 100 UNIT/ML ~~LOC~~ SOLN
0.0000 [IU] | Freq: Every day | SUBCUTANEOUS | Status: DC
  Administered 2019-11-30 – 2019-12-02 (×2): 5 [IU] via SUBCUTANEOUS
  Filled 2019-11-30 (×3): qty 1

## 2019-11-30 MED ORDER — INSULIN ASPART 100 UNIT/ML ~~LOC~~ SOLN
15.0000 [IU] | Freq: Once | SUBCUTANEOUS | Status: DC
  Administered 2019-11-30: 12:00:00 15 [IU] via SUBCUTANEOUS

## 2019-11-30 MED ORDER — INSULIN ASPART 100 UNIT/ML ~~LOC~~ SOLN
0.0000 [IU] | Freq: Three times a day (TID) | SUBCUTANEOUS | Status: DC
  Administered 2019-11-30: 17:00:00 20 [IU] via SUBCUTANEOUS
  Administered 2019-12-01: 13:00:00 15 [IU] via SUBCUTANEOUS
  Administered 2019-12-01: 11 [IU] via SUBCUTANEOUS
  Administered 2019-12-01: 20 [IU] via SUBCUTANEOUS
  Administered 2019-12-02 (×2): 11 [IU] via SUBCUTANEOUS
  Administered 2019-12-03: 14:00:00 15 [IU] via SUBCUTANEOUS
  Administered 2019-12-03: 7 [IU] via SUBCUTANEOUS
  Filled 2019-11-30 (×8): qty 1

## 2019-11-30 NOTE — Progress Notes (Addendum)
[place] [facility] [place] NAME: Brittany Morris    MR#:  [id number]  DATE OF BIRTH:  14-May-1966  SUBJECTIVE:  CHIEF COMPLAINT:   Chief Complaint  Patient presents with  . Weakness  . Emesis  Weak.  Emotional, recent Covid issues with her parents brings flashbacks/bad memories, sugars running high 300s to 400s REVIEW OF SYSTEMS:  Review of Systems  Constitutional: Positive for malaise/fatigue. Negative for diaphoresis, fever and weight loss.  HENT: Negative for ear discharge, ear pain, hearing loss, nosebleeds, sore throat and tinnitus.   Eyes: Negative for blurred vision and pain.  Respiratory: Positive for shortness of breath. Negative for cough, hemoptysis and wheezing.   Cardiovascular: Negative for chest pain, palpitations, orthopnea and leg swelling.  Gastrointestinal: Negative for abdominal pain, blood in stool, constipation, diarrhea, heartburn, nausea and vomiting.  Genitourinary: Negative for dysuria, frequency and urgency.  Musculoskeletal: Negative for back pain and myalgias.  Skin: Negative for itching and rash.  Neurological: Negative for dizziness, tingling, tremors, focal weakness, seizures, weakness and headaches.  Psychiatric/Behavioral: Negative for depression. The patient is not nervous/anxious.    DRUG ALLERGIES:   Allergies  Allergen Reactions  . Ciprofloxacin Rash   VITALS:  Blood pressure 132/68, pulse 62, temperature 97.9 F (36.6 C), temperature source Oral, resp. rate 12, height 5\' 3"  (1.6 m), weight 104.3 kg, SpO2 92 %. PHYSICAL EXAMINATION:  Physical Exam HENT:     Head: Normocephalic and atraumatic.  Eyes:     Conjunctiva/sclera: Conjunctivae normal.     Pupils: Pupils are equal, round, and reactive to light.  Neck:     Thyroid: No thyromegaly.     Trachea: No tracheal deviation.  Cardiovascular:     Rate and Rhythm: Normal rate and regular rhythm.     Heart sounds: Normal heart sounds.  Pulmonary:     Effort:  Pulmonary effort is normal. No respiratory distress.     Breath sounds: Normal breath sounds. No wheezing.  Chest:     Chest wall: No tenderness.  Abdominal:     General: Bowel sounds are normal. There is no distension.     Palpations: Abdomen is soft.     Tenderness: There is no abdominal tenderness.  Musculoskeletal:        General: Normal range of motion.     Cervical back: Normal range of motion and neck supple.  Skin:    General: Skin is warm and dry.     Findings: No rash.  Neurological:     Mental Status: She is alert and oriented to person, place, and time.     Cranial Nerves: No cranial nerve deficit.    LABORATORY PANEL:  Female CBC Recent Labs  Lab 11/30/19 0456  WBC 2.7*  HGB 13.7  HCT 40.6  PLT 159   ------------------------------------------------------------------------------------------------------------------ Chemistries  Recent Labs  Lab 11/30/19 0456  NA 134*  K 5.1  CL 100  CO2 25  GLUCOSE 349*  BUN 15  CREATININE 0.66  CALCIUM 8.7*  AST 11*  ALT 11  ALKPHOS 58  BILITOT 0.6   RADIOLOGY:  No results found. ASSESSMENT AND PLAN:  Brittany Morris is a 54 y.o. female with medical history significant of hypertension, hyperlipidemia, diabetes mellitus, GERD, who presents with generalized weakness, shortness breath, nausea, vomiting, diarrhea.  Patient had a positive COVID-19 test last Thursday, and not been feeling well in the past several days.  She has mild shortness breath and  mild cough. No CP. She has fever with temperature 100.1 in ED. She had generalized weakness, poor appetite, nausea, vomiting and diarrhea. Oxygen desaturation to 85% on room air, which improved to 94% on 2 L nasal cannula oxygen.  ED Course: pt was found to have positive COVID-19 Ag test, WBC 4.5, lactic acid 1.3, sodium 127, renal function okay, temperature 100.1, blood pressure 142/86, tachycardia, RR 18, chest x-ray showed patchy peripheral infiltration.   Acute  respiratory failure with hypoxia due to pneumonia due to COVID-19 virus: Patient has oxygen desaturation to 85% on room air.  Chest x-ray showed patchy peripheral infiltration. -Remdesivir day 2/5 -Solumedrol 60 mg bid  -vitamin C, zinc.  -Bronchodilators -PRN Mucinex for cough -Negative blood culture -Gentle IV fluid:  -Daily CRP, Ferritin, D-dimer, -Will ask the patient to maintain an awake prone position for 16+ hours a day, if possible, with a minimum of 2-3 hours at a time -Will attempt to maintain euvolemia to a net negative fluid status  HTN:  -Continue home medications: Lisinopril -hold lasix due to hyponatremia -hydralazine prn  High cholesterol -zocor  Diabetes mellitus without complication (HCC): Most recent A1c not on record. Patient is taking Ozempic, Trulicity, Novolin at home -SSI, add Levemir 15 units twice a day and NovoLog 5 units 3 times daily with each meal -Check A1c -Diabetic nurse recommendation appreciated  Hyponatremia: Most likely due to GI loss. Poor oral intake, nausea, vomiting and diarrhea.  -Low TSH.  Free T3 and T4  -Improving with hydration continue with IV normal saline at 75 mL/h - f/u by BMP  Nausea vomiting and diarrhea: Most likely due to COVID-19 infection -Supportive care -As needed Zofran -IV fluid   Status is: Inpatient  Remains inpatient appropriate because:Inpatient level of care appropriate due to severity of illness   Dispo: The patient is from: Home              Anticipated d/c is to: Home              Anticipated d/c date is: 3 days              Patient currently is not medically stable to d/c.        DVT prophylaxis: Lovenox Family Communication: discussed with patient   All the records are reviewed and case discussed with Care Management/Social Worker. Management plans discussed with the patient, nursing and they are in agreement.  CODE STATUS: Full Code  TOTAL TIME TAKING CARE OF THIS PATIENT: 35  minutes.   More than 50% of the time was spent in counseling/coordination of care: YES  POSSIBLE D/C IN 3-4 DAYS, DEPENDING ON CLINICAL CONDITION.   Delfino Lovett M.D on 11/30/2019 at 1:42 PM  [facility]   CC: Primary care physician; System, Pcp Not In  Note: This dictation was prepared with Dragon dictation along with smaller phrase technology. Any transcriptional errors that result from this process are unintentional.

## 2019-11-30 NOTE — Progress Notes (Signed)
Inpatient Diabetes Program Recommendations  AACE/ADA: New Consensus Statement on Inpatient Glycemic Control   Target Ranges:  Prepandial:   less than 140 mg/dL      Peak postprandial:   less than 180 mg/dL (1-2 hours)      Critically ill patients:  140 - 180 mg/dL  Results for CORAIMA, TIBBS (MRN [id number]) as of 11/30/2019 10:09  Ref. Range 11/29/2019 13:34 11/29/2019 18:37 11/29/2019 21:11 11/30/2019 08:14  Glucose-Capillary Latest Ref Range: 70 - 99 mg/dL 250 (H) 539 (H) 767 (H) 396 (H)    Review of Glycemic Control  Diabetes history: DM2 Outpatient Diabetes medications: Ozempic 0.25 mg Qweek, NPH 20 units BID, Novolog 7 units TID with meals plus additional units for correction (per [facility] Endocrinology note on 09/15/19) Current orders for Inpatient glycemic control: Novolog 0-9 units TID with meals, Novolog 0-5 units QHS; Solumedrol 60 mg Q12H  Inpatient Diabetes Program Recommendations:   Insulin - Basal: Please consider ordering Levemir 15 units BID (to start now).  Correction (SSI): Please consider increasing Novolog correction to resistant scale (0-20 units TID with meals).  Insulin - Meal Coverage: Please consider ordering Novolog 5 units TID with meals if patient eats at least 50% of meals.  HbgA1C:  A1C in process.  NOTE: In reviewing chart, noted patient sees [facility] and was seen by Dr. Cory Roughen on 09/15/19. Per note on 09/15/19 by Dr. Cory Roughen patient is prescribed Ozempic 0.25 mg Qweek, NPH 20 units BID, Novolog 7 units TID with meals plus additional units for correction and last A1C was 6.8% on 05/29/19. Also noted that patient has Prednisone on home medication list and takes Prednisone chronically for sarcoidosis. Patient admitted with pneumonia due to COVID and is ordered steroids. Recommend adding Levemir, increasing Novolog correction scale and adding Novolog meal coverage. Will continue to follow along while inpatient.   Thanks, Orlando Penner, RN, MSN, CDE Diabetes  Coordinator Inpatient Diabetes Program [phone] (Team Pager from 8am to 5pm)

## 2019-12-01 LAB — HEMOGLOBIN A1C
Hgb A1c MFr Bld: 9.8 % — ABNORMAL HIGH (ref 4.8–5.6)
Hgb A1c MFr Bld: 9.5 % — ABNORMAL HIGH (ref 4.8–5.6)
Mean Plasma Glucose: 235 mg/dL
Mean Plasma Glucose: 226 mg/dL

## 2019-12-01 LAB — COMPREHENSIVE METABOLIC PANEL
ALT: 14 U/L (ref 0–44)
AST: 21 U/L (ref 15–41)
Albumin: 3 g/dL — ABNORMAL LOW (ref 3.5–5.0)
Alkaline Phosphatase: 59 U/L (ref 38–126)
Anion gap: 6 (ref 5–15)
BUN: 18 mg/dL (ref 6–20)
CO2: 25 mmol/L (ref 22–32)
Calcium: 8.5 mg/dL — ABNORMAL LOW (ref 8.9–10.3)
Chloride: 103 mmol/L (ref 98–111)
Creatinine, Ser: 0.78 mg/dL (ref 0.44–1.00)
GFR calc Af Amer: >60 mL/min (ref >60)
GFR calc non Af Amer: >60 mL/min (ref >60)
Glucose, Bld: 454 mg/dL — ABNORMAL HIGH (ref 70–99)
Potassium: 4 mmol/L (ref 3.5–5.1)
Sodium: 134 mmol/L — ABNORMAL LOW (ref 135–145)
Total Bilirubin: 0.4 mg/dL (ref 0.3–1.2)
Total Protein: 6.1 g/dL — ABNORMAL LOW (ref 6.5–8.1)

## 2019-12-01 LAB — CBC WITH DIFFERENTIAL/PLATELET
Abs Immature Granulocytes: 0.04 10*3/uL (ref 0.00–0.07)
Basophils Absolute: 0 10*3/uL (ref 0.0–0.1)
Basophils Relative: 0 %
Eosinophils Absolute: 0 10*3/uL (ref 0.0–0.5)
Eosinophils Relative: 0 %
HCT: 39.8 % (ref 36.0–46.0)
Hemoglobin: 13.2 g/dL (ref 12.0–15.0)
Immature Granulocytes: 1 %
Lymphocytes Relative: 6 %
Lymphs Abs: 0.4 10*3/uL — ABNORMAL LOW (ref 0.7–4.0)
MCH: 28.1 pg (ref 26.0–34.0)
MCHC: 33.2 g/dL (ref 30.0–36.0)
MCV: 84.9 fL (ref 80.0–100.0)
Monocytes Absolute: 0.3 10*3/uL (ref 0.1–1.0)
Monocytes Relative: 3 %
Neutro Abs: 7 10*3/uL (ref 1.7–7.7)
Neutrophils Relative %: 90 %
Platelets: 194 10*3/uL (ref 150–400)
RBC: 4.69 MIL/uL (ref 3.87–5.11)
RDW: 13.2 % (ref 11.5–15.5)
WBC: 7.7 10*3/uL (ref 4.0–10.5)
nRBC: 0 % (ref 0.0–0.2)

## 2019-12-01 LAB — GLUCOSE, CAPILLARY
Glucose-Capillary: 417 mg/dL — ABNORMAL HIGH (ref 70–99)
Glucose-Capillary: 401 mg/dL — ABNORMAL HIGH (ref 70–99)
Glucose-Capillary: 346 mg/dL — ABNORMAL HIGH (ref 70–99)
Glucose-Capillary: 292 mg/dL — ABNORMAL HIGH (ref 70–99)
Glucose-Capillary: 176 mg/dL — ABNORMAL HIGH (ref 70–99)

## 2019-12-01 LAB — T3, FREE: T3, Free: 1.9 pg/mL — ABNORMAL LOW (ref 2.0–4.4)

## 2019-12-01 LAB — FIBRIN DERIVATIVES D-DIMER (ARMC ONLY): Fibrin derivatives D-dimer (ARMC): 893.15 ng/mL (FEU) — ABNORMAL HIGH (ref 0.00–499.00)

## 2019-12-01 LAB — FERRITIN: Ferritin: 366 ng/mL — ABNORMAL HIGH (ref 11–307)

## 2019-12-01 LAB — C-REACTIVE PROTEIN: CRP: 4.2 mg/dL — ABNORMAL HIGH (ref <1.0)

## 2019-12-01 MED ORDER — INSULIN DETEMIR 100 UNIT/ML ~~LOC~~ SOLN
30.0000 [IU] | Freq: Two times a day (BID) | SUBCUTANEOUS | Status: DC
  Administered 2019-12-01 (×2): 30 [IU] via SUBCUTANEOUS
  Filled 2019-12-01 (×4): qty 0.3

## 2019-12-01 MED ORDER — METHYLPREDNISOLONE SODIUM SUCC 125 MG IJ SOLR
60.0000 mg | Freq: Every day | INTRAMUSCULAR | Status: DC
  Administered 2019-12-02: 60 mg via INTRAVENOUS
  Filled 2019-12-01: qty 2

## 2019-12-01 MED ORDER — INSULIN ASPART 100 UNIT/ML ~~LOC~~ SOLN
10.0000 [IU] | Freq: Three times a day (TID) | SUBCUTANEOUS | Status: DC
  Administered 2019-12-01 – 2019-12-03 (×3): 10 [IU] via SUBCUTANEOUS
  Filled 2019-12-01 (×4): qty 1

## 2019-12-01 MED ORDER — FUROSEMIDE 10 MG/ML IJ SOLN
40.0000 mg | Freq: Once | INTRAMUSCULAR | Status: AC
  Administered 2019-12-01: 40 mg via INTRAVENOUS
  Filled 2019-12-01: qty 4

## 2019-12-01 NOTE — Progress Notes (Addendum)
Inpatient Diabetes Program Recommendations  AACE/ADA: New Consensus Statement on Inpatient Glycemic Control   Target Ranges:  Prepandial:   less than 140 mg/dL      Peak postprandial:   less than 180 mg/dL (1-2 hours)      Critically ill patients:  140 - 180 mg/dL  Results for Brittany Morris, Brittany Morris (MRN [id number]) as of 12/01/2019 08:19  Ref. Range 11/30/2019 08:14 11/30/2019 12:05 11/30/2019 17:01 11/30/2019 23:34 12/01/2019 07:45  Glucose-Capillary Latest Ref Range: 70 - 99 mg/dL 673 (H) 419 (H) 379 (H) 448 (H) 417 (H)   Results for Brittany Morris, Brittany Morris (MRN [id number]) as of 12/01/2019 08:19  Ref. Range 11/29/2019 09:36 11/30/2019 04:56  Hemoglobin A1C Latest Ref Range: 4.8 - 5.6 % 9.8 (H) 9.5 (H)   Review of Glycemic Control  Diabetes history: DM2 Outpatient Diabetes medications: Ozempic 0.25 mg Qweek, NPH 20 units BID, Novolog 7 units TID with meals plus additional units for correction (per [facility] Endocrinology note on 09/15/19) Current orders for Inpatient glycemic control: Levemir 15 units BID, Novolog 0-20 units TID with meals, Novolog 0-5 units QHS, Novolog 5 units TID with meals; Solumedrol 60 mg Q12H  Inpatient Diabetes Program Recommendations:   Insulin - Basal: If steroids are continued as ordered, please consider increasing Levemir to 30 units BID.  Insulin - Meal Coverage: Please consider increasing meal coverage to Novolog 10 units TID with meals if patient eats at least 50% of meals.  HbgA1C:  A1C 9.5% on 11/30/19 indicating an average glucose of 226 mg/dl over the past 2-3 months. Patient reports that her last A1C was 6.3%.  Addendum 12/01/19@13 :48-Spoke with patient over the phone about diabetes and home regimen for diabetes control. Patient confirms that she sees [facility] Endocrinology for diabetes management and currently taking Ozempic 0.25 mg Qweek (Saturday), NPH 20 units BID, Novolog 7 units TID with meals plus additional units for correction as an outpatient for diabetes control.  Patient reports taking DM medications as prescribed except she did not take Ozempic this past Saturday at home because she was feeling so poorly.  Patient reports usually runs 100-180 mg/dl.  Inquired about prior A1C and patient reports her last A1C was 6.3%. Discussed A1C results (9.5% on 11/30/19) and explained that current A1C indicates an average glucose of 226 mg/dl over the past 2-3 months.  Patient reports that she is very surprised that her A1C is that high as she has been working hard to keep DM well controlled.  Pateint notes that she has been feeling bad since Easter Sunday due to COVID. Discussed how sickness can impact glycemic control. Patient also notes that she takes Prednisone 2.5 mg daily chronically for adrenal insufficiency and she is aware of how steroids impact DM control.  Explained that she is currently ordered Solumedrol which is a steroid and is contributing to hyperglycemia. Discussed changes made today with insulin regimen to try to get glucose better controlled.  Encouraged patient to continue to work with Endocrinologist to keep DM controlled and encouraged her to reach out to her Endocrinologist if glucose continues to be consistently elevated at home once she is discharged.  Patient verbalized understanding of information discussed and reports no further questions at this time related to diabetes.  Thanks, Brittany Penner, RN, MSN, CDE Diabetes Coordinator Inpatient Diabetes Program [phone] (Team Pager from 8am to 5pm)

## 2019-12-01 NOTE — Progress Notes (Signed)
1        [place] at [facility]   PATIENT NAME: Elveria Lauderbaugh    MR#:  [id number]  DATE OF BIRTH:  [birth year]  SUBJECTIVE:  CHIEF COMPLAINT:   Chief Complaint  Patient presents with  . Weakness  . Emesis  Weak. emotional, reports some issues with her stay here and requesting transfer to [facility] as all her doctors are there, sugars running high 300s to 400s REVIEW OF SYSTEMS:  Review of Systems  Constitutional: Positive for malaise/fatigue. Negative for diaphoresis, fever and weight loss.  HENT: Negative for ear discharge, ear pain, hearing loss, nosebleeds, sore throat and tinnitus.   Eyes: Negative for blurred vision and pain.  Respiratory: Positive for shortness of breath. Negative for cough, hemoptysis and wheezing.   Cardiovascular: Negative for chest pain, palpitations, orthopnea and leg swelling.  Gastrointestinal: Negative for abdominal pain, blood in stool, constipation, diarrhea, heartburn, nausea and vomiting.  Genitourinary: Negative for dysuria, frequency and urgency.  Musculoskeletal: Negative for back pain and myalgias.  Skin: Negative for itching and rash.  Neurological: Negative for dizziness, tingling, tremors, focal weakness, seizures, weakness and headaches.  Psychiatric/Behavioral: Positive for depression. The patient is not nervous/anxious.    DRUG ALLERGIES:   Allergies  Allergen Reactions  . Ciprofloxacin Rash   VITALS:  Blood pressure (!) 163/89, pulse 71, temperature 98.4 F (36.9 C), temperature source Oral, resp. rate 20, height 5\' 3"  (1.6 m), weight 104.3 kg, SpO2 97 %. PHYSICAL EXAMINATION:  Physical Exam HENT:     Head: Normocephalic and atraumatic.  Eyes:     Conjunctiva/sclera: Conjunctivae normal.     Pupils: Pupils are equal, round, and reactive to light.  Neck:     Thyroid: No thyromegaly.     Trachea: No tracheal deviation.  Cardiovascular:     Rate and Rhythm: Normal rate and regular rhythm.     Heart sounds: Normal heart  sounds.  Pulmonary:     Effort: Pulmonary effort is normal. No respiratory distress.     Breath sounds: Normal breath sounds. No wheezing.  Chest:     Chest wall: No tenderness.  Abdominal:     General: Bowel sounds are normal. There is no distension.     Palpations: Abdomen is soft.     Tenderness: There is no abdominal tenderness.  Musculoskeletal:        General: Normal range of motion.     Cervical back: Normal range of motion and neck supple.  Skin:    General: Skin is warm and dry.     Findings: No rash.  Neurological:     Mental Status: She is alert and oriented to person, place, and time.     Cranial Nerves: No cranial nerve deficit.  Psychiatric:        Mood and Affect: Mood is depressed. Affect is tearful.    LABORATORY PANEL:  Female CBC Recent Labs  Lab 12/01/19 0706  WBC 7.7  HGB 13.2  HCT 39.8  PLT 194   ------------------------------------------------------------------------------------------------------------------ Chemistries  Recent Labs  Lab 11/30/19 0456 11/30/19 0456 12/01/19 0706  NA 134*   < > 134*  K 5.1   < > 4.0  CL 100   < > 103  CO2 25   < > 25  GLUCOSE 349*   < > 454*  BUN 15   < > 18  CREATININE 0.66   < > 0.78  CALCIUM 8.7*   < > 8.5*  MG 2.0  --   --  AST 11*   < > 21  ALT 11   < > 14  ALKPHOS 58   < > 59  BILITOT 0.6   < > 0.4   < > = values in this interval not displayed.   RADIOLOGY:  No results found. ASSESSMENT AND PLAN:  ANNALEE MEYERHOFF is a 54 y.o. female with medical history significant of hypertension, hyperlipidemia, diabetes mellitus, GERD, who presents with generalized weakness, shortness breath, nausea, vomiting, diarrhea.  Patient had a positive COVID-19 test last Thursday, and not been feeling well in the past several days.  She has mild shortness breath and mild cough. No CP. She has fever with temperature 100.1 in ED. She had generalized weakness, poor appetite, nausea, vomiting and diarrhea. Oxygen  desaturation to 85% on room air, which improved to 94% on 2 L nasal cannula oxygen.  ED Course: pt was found to have positive COVID-19 Ag test, WBC 4.5, lactic acid 1.3, sodium 127, renal function okay, temperature 100.1, blood pressure 142/86, tachycardia, RR 18, chest x-ray showed patchy peripheral infiltration.   Acute respiratory failure with hypoxia due to pneumonia due to COVID-19 virus: Patient has oxygen desaturation to 85% on room air.  Chest x-ray showed patchy peripheral infiltration. -Remdesivir day 3/5. Minimal exertion increased her O2 need to 5 liters from 2-3 liters earlier -taper Solumedrol 60 mg bid to once daily -vitamin C, zinc.  -Bronchodilators -PRN Mucinex for cough -Negative blood culture -will try one dose of 40 mg IV lasix today -Daily CRP, Ferritin, D-dimer, - maintain an awake prone position for 16+ hours a day, if possible, with a minimum of 2-3 hours at a time -Will attempt to maintain euvolemia to a net negative fluid status  HTN:  -Continue home medications: Lisinopril -give lasix once today -hydralazine prn  High cholesterol -zocor  Diabetes mellitus without complication (HCC): Most recent A1c not on record. Patient is taking Ozempic, Trulicity, Novolin at home -SSI, increase Levemir 30 units twice a day and NovoLog 10 units 3 times daily with each meal - A1c 9.5 -Diabetic nurse recommendation appreciated  Hyponatremia: Most likely due to GI loss. Poor oral intake, nausea, vomiting and diarrhea.  -Low TSH. low Free T3 and normal T4 - likely euthyroid sick -stop fluids as Na 134  Nausea vomiting and diarrhea: Most likely due to COVID-19 infection -Supportive care -As needed Zofran -resolved, tolerating diet   Status is: Inpatient  Remains inpatient appropriate because:Inpatient level of care appropriate due to severity of illness   Dispo: The patient is from: Home              Anticipated d/c is to: Home              Anticipated d/c  date is: 2 days              Patient currently is not medically stable to d/c.    At patient request I made call to [facility] transfer center but Hospitalist there didn't accept the patient.    DVT prophylaxis: Lovenox Family Communication: discussed with patient   All the records are reviewed and case discussed with Care Management/Social Worker. Management plans discussed with the patient, nursing and they are in agreement.  CODE STATUS: Full Code  TOTAL TIME TAKING CARE OF THIS PATIENT: 35 minutes.   More than 50% of the time was spent in counseling/coordination of care: YES  POSSIBLE D/C IN 2-3 DAYS, DEPENDING ON CLINICAL CONDITION.   Delfino Lovett M.D on 12/01/2019  at 5:50 PM  [facility]   CC: Primary care physician; System, Pcp Not In  Note: This dictation was prepared with Dragon dictation along with smaller phrase technology. Any transcriptional errors that result from this process are unintentional.

## 2019-12-02 LAB — COMPREHENSIVE METABOLIC PANEL
ALT: 18 U/L (ref 0–44)
AST: 30 U/L (ref 15–41)
Albumin: 3.1 g/dL — ABNORMAL LOW (ref 3.5–5.0)
Alkaline Phosphatase: 51 U/L (ref 38–126)
Anion gap: 10 (ref 5–15)
BUN: 15 mg/dL (ref 6–20)
CO2: 28 mmol/L (ref 22–32)
Calcium: 8.7 mg/dL — ABNORMAL LOW (ref 8.9–10.3)
Chloride: 102 mmol/L (ref 98–111)
Creatinine, Ser: 0.62 mg/dL (ref 0.44–1.00)
GFR calc Af Amer: >60 mL/min (ref >60)
GFR calc non Af Amer: >60 mL/min (ref >60)
Glucose, Bld: 94 mg/dL (ref 70–99)
Potassium: 3.1 mmol/L — ABNORMAL LOW (ref 3.5–5.1)
Sodium: 140 mmol/L (ref 135–145)
Total Bilirubin: 0.5 mg/dL (ref 0.3–1.2)
Total Protein: 5.9 g/dL — ABNORMAL LOW (ref 6.5–8.1)

## 2019-12-02 LAB — CBC WITH DIFFERENTIAL/PLATELET
Abs Immature Granulocytes: 0.05 10*3/uL (ref 0.00–0.07)
Basophils Absolute: 0 10*3/uL (ref 0.0–0.1)
Basophils Relative: 0 %
Eosinophils Absolute: 0 10*3/uL (ref 0.0–0.5)
Eosinophils Relative: 0 %
HCT: 39.5 % (ref 36.0–46.0)
Hemoglobin: 13.4 g/dL (ref 12.0–15.0)
Immature Granulocytes: 1 %
Lymphocytes Relative: 19 %
Lymphs Abs: 1.8 10*3/uL (ref 0.7–4.0)
MCH: 28.3 pg (ref 26.0–34.0)
MCHC: 33.9 g/dL (ref 30.0–36.0)
MCV: 83.3 fL (ref 80.0–100.0)
Monocytes Absolute: 0.7 10*3/uL (ref 0.1–1.0)
Monocytes Relative: 7 %
Neutro Abs: 7 10*3/uL (ref 1.7–7.7)
Neutrophils Relative %: 73 %
Platelets: 226 10*3/uL (ref 150–400)
RBC: 4.74 MIL/uL (ref 3.87–5.11)
RDW: 13 % (ref 11.5–15.5)
WBC: 9.5 10*3/uL (ref 4.0–10.5)
nRBC: 0 % (ref 0.0–0.2)

## 2019-12-02 LAB — GLUCOSE, CAPILLARY
Glucose-Capillary: 67 mg/dL — ABNORMAL LOW (ref 70–99)
Glucose-Capillary: 262 mg/dL — ABNORMAL HIGH (ref 70–99)
Glucose-Capillary: 283 mg/dL — ABNORMAL HIGH (ref 70–99)
Glucose-Capillary: 437 mg/dL — ABNORMAL HIGH (ref 70–99)

## 2019-12-02 LAB — C-REACTIVE PROTEIN: CRP: 1.9 mg/dL — ABNORMAL HIGH (ref <1.0)

## 2019-12-02 LAB — FERRITIN: Ferritin: 295 ng/mL (ref 11–307)

## 2019-12-02 LAB — FIBRIN DERIVATIVES D-DIMER (ARMC ONLY): Fibrin derivatives D-dimer (ARMC): 784.81 ng/mL (FEU) — ABNORMAL HIGH (ref 0.00–499.00)

## 2019-12-02 LAB — MAGNESIUM: Magnesium: 1.9 mg/dL (ref 1.7–2.4)

## 2019-12-02 MED ORDER — METHYLPREDNISOLONE SODIUM SUCC 40 MG IJ SOLR
40.0000 mg | Freq: Every day | INTRAMUSCULAR | Status: DC
  Administered 2019-12-03: 10:00:00 40 mg via INTRAVENOUS
  Filled 2019-12-02: qty 1

## 2019-12-02 MED ORDER — INSULIN DETEMIR 100 UNIT/ML ~~LOC~~ SOLN
15.0000 [IU] | Freq: Two times a day (BID) | SUBCUTANEOUS | Status: DC
  Administered 2019-12-02: 15 [IU] via SUBCUTANEOUS
  Filled 2019-12-02 (×3): qty 0.15

## 2019-12-02 MED ORDER — POTASSIUM CHLORIDE CRYS ER 20 MEQ PO TBCR
40.0000 meq | EXTENDED_RELEASE_TABLET | Freq: Once | ORAL | Status: AC
  Administered 2019-12-02: 15:00:00 40 meq via ORAL
  Filled 2019-12-02: qty 2

## 2019-12-02 MED ORDER — FUROSEMIDE 10 MG/ML IJ SOLN
40.0000 mg | Freq: Once | INTRAMUSCULAR | Status: AC
  Administered 2019-12-02: 40 mg via INTRAVENOUS
  Filled 2019-12-02: qty 4

## 2019-12-02 MED ORDER — INSULIN DETEMIR 100 UNIT/ML ~~LOC~~ SOLN
25.0000 [IU] | Freq: Two times a day (BID) | SUBCUTANEOUS | Status: DC
  Administered 2019-12-02: 12:00:00 25 [IU] via SUBCUTANEOUS
  Filled 2019-12-02 (×3): qty 0.25

## 2019-12-02 MED ORDER — MAGNESIUM LACTATE 84 MG (7MEQ) PO TBCR
84.0000 mg | EXTENDED_RELEASE_TABLET | Freq: Every day | ORAL | Status: DC
  Administered 2019-12-02 – 2019-12-03 (×2): 84 mg via ORAL
  Filled 2019-12-02 (×2): qty 1

## 2019-12-02 NOTE — Progress Notes (Signed)
OT Screen Note  Patient Details Name: [name] MRN: [id number] DOB: 1966-06-21   OT screened:    Reason Eval/Treat Not Completed: OT screened, no needs identified, will sign off. Thank you for the OT consult. Order received and chart reviewed. Upon arrival to pt room, pt noted to be with physical therapist, finishing up session. Pt observed to stand without assist in room, completing standing wash-up with physical therapist. OT provided pt with energy conservation handout to support safety and functional independence upon hospital discharge. Pt appears to be at or near baseline level of functional independence to perform ADL tasks. No skilled needs identified. Will sign off at this time. Please re-consult if additional OT needs arise during this admission.  Rockney Ghee, M.S., OTR/L Ascom: [phone] 12/02/19, 2:43 PM

## 2019-12-02 NOTE — Progress Notes (Signed)
Inpatient Diabetes Program Recommendations  AACE/ADA: New Consensus Statement on Inpatient Glycemic Control   Target Ranges:  Prepandial:   less than 140 mg/dL      Peak postprandial:   less than 180 mg/dL (1-2 hours)      Critically ill patients:  140 - 180 mg/dL   Results for ALIENE, TAMURA (MRN [id number]) as of 12/02/2019 08:53  Ref. Range 12/01/2019 07:45 12/01/2019 11:23 12/01/2019 13:14 12/01/2019 17:29 12/01/2019 21:06 12/02/2019 08:00  Glucose-Capillary Latest Ref Range: 70 - 99 mg/dL 486 (H) 282 (H) 417 (H) 292 (H) 176 (H) 67 (L)   Review of Glycemic Control  Diabetes history:DM2 Outpatient Diabetes medications:Ozempic 0.25 mg Qweek, NPH 20 units BID, Novolog 7 units TID with meals plus additional units for correction (per [facility] Endocrinology note on 09/15/19) Current orders for Inpatient glycemic control:Levemir 30 units BID, Novolog 0-20 units TID with meals, Novolog 0-5 units QHS, Novolog 10 units TID with meals; Solumedrol 60 mg daily  Inpatient Diabetes Program Recommendations:  Insulin - Basal: If steroids are continued as ordered, please consider decreaasing Levemir to 25 units BID.  Thanks, Orlando Penner, RN, MSN, CDE Diabetes Coordinator Inpatient Diabetes Program [phone] (Team Pager from 8am to 5pm)

## 2019-12-02 NOTE — Care Management Important Message (Signed)
Important Message  Patient Details  Name: Brittany Morris MRN: [id number] Date of Birth: May 25, 1966   Medicare Important Message Given:  Yes  Gave to bedside to give to pt due to in COVID room   Lucy Chris, LCSW 12/02/2019, 10:49 AM

## 2019-12-02 NOTE — Progress Notes (Signed)
1        [place] at [facility]   PATIENT NAME: Brittany Morris    MR#:  [id number]  DATE OF BIRTH:  10-01-1965  SUBJECTIVE:  CHIEF COMPLAINT:   Chief Complaint  Patient presents with  . Weakness  . Emesis  Blood sugar much better control and low at times.  She reports not able to sleep last night.  Has exertional dyspnea and hypoxia REVIEW OF SYSTEMS:  Review of Systems  Constitutional: Positive for malaise/fatigue. Negative for diaphoresis, fever and weight loss.  HENT: Negative for ear discharge, ear pain, hearing loss, nosebleeds, sore throat and tinnitus.   Eyes: Negative for blurred vision and pain.  Respiratory: Positive for shortness of breath. Negative for cough, hemoptysis and wheezing.   Cardiovascular: Negative for chest pain, palpitations, orthopnea and leg swelling.  Gastrointestinal: Negative for abdominal pain, blood in stool, constipation, diarrhea, heartburn, nausea and vomiting.  Genitourinary: Negative for dysuria, frequency and urgency.  Musculoskeletal: Negative for back pain and myalgias.  Skin: Negative for itching and rash.  Neurological: Negative for dizziness, tingling, tremors, focal weakness, seizures, weakness and headaches.  Psychiatric/Behavioral: Positive for depression. The patient is not nervous/anxious.    DRUG ALLERGIES:   Allergies  Allergen Reactions  . Ciprofloxacin Rash   VITALS:  Blood pressure 106/69, pulse 85, temperature 98.1 F (36.7 C), temperature source Oral, resp. rate 17, height 5\' 3"  (1.6 m), weight 104.3 kg, SpO2 93 %. PHYSICAL EXAMINATION:  Physical Exam HENT:     Head: Normocephalic and atraumatic.  Eyes:     Conjunctiva/sclera: Conjunctivae normal.     Pupils: Pupils are equal, round, and reactive to light.  Neck:     Thyroid: No thyromegaly.     Trachea: No tracheal deviation.  Cardiovascular:     Rate and Rhythm: Normal rate and regular rhythm.     Heart sounds: Normal heart sounds.  Pulmonary:   Effort: Pulmonary effort is normal. No respiratory distress.     Breath sounds: Normal breath sounds. No wheezing.  Chest:     Chest wall: No tenderness.  Abdominal:     General: Bowel sounds are normal. There is no distension.     Palpations: Abdomen is soft.     Tenderness: There is no abdominal tenderness.  Musculoskeletal:        General: Normal range of motion.     Cervical back: Normal range of motion and neck supple.  Skin:    General: Skin is warm and dry.     Findings: No rash.  Neurological:     Mental Status: She is alert and oriented to person, place, and time.     Cranial Nerves: No cranial nerve deficit.  Psychiatric:        Mood and Affect: Mood is depressed. Affect is tearful.    LABORATORY PANEL:  Female CBC Recent Labs  Lab 12/02/19 0617  WBC 9.5  HGB 13.4  HCT 39.5  PLT 226   ------------------------------------------------------------------------------------------------------------------ Chemistries  Recent Labs  Lab 12/02/19 0617  NA 140  K 3.1*  CL 102  CO2 28  GLUCOSE 94  BUN 15  CREATININE 0.62  CALCIUM 8.7*  MG 1.9  AST 30  ALT 18  ALKPHOS 51  BILITOT 0.5   RADIOLOGY:  No results found. ASSESSMENT AND PLAN:  MACEE Morris is a 53 y.o. female with medical history significant of hypertension, hyperlipidemia, diabetes mellitus, GERD, who presents with generalized weakness, shortness breath, nausea, vomiting, diarrhea.  Patient  had a positive COVID-19 test last Thursday, and not been feeling well in the past several days.  She has mild shortness breath and mild cough. No CP. She has fever with temperature 100.1 in ED. She had generalized weakness, poor appetite, nausea, vomiting and diarrhea. Oxygen desaturation to 85% on room air, which improved to 94% on 2 L nasal cannula oxygen.  ED Course: pt was found to have positive COVID-19 Ag test, WBC 4.5, lactic acid 1.3, sodium 127, renal function okay, temperature 100.1, blood pressure  142/86, tachycardia, RR 18, chest x-ray showed patchy peripheral infiltration.   Acute respiratory failure with hypoxia due to pneumonia due to COVID-19 virus: Patient has oxygen desaturation to 85% on room air.  Chest x-ray showed patchy peripheral infiltration. -Remdesivir day 4/5.  She is on room air now -taper Solumedrol 60->40 mg once daily -vitamin C, zinc.  -Bronchodilators -PRN Mucinex for cough -Negative blood culture -will try one more dose of 40 mg IV lasix today -Daily CRP, Ferritin, D-dimer, - maintain an awake prone position for 16+ hours a day, if possible, with a minimum of 2-3 hours at a time -Will attempt to maintain euvolemia to a net negative fluid status  HTN:  -Continue home medications: Lisinopril -give lasix once today -hydralazine prn  High cholesterol -zocor  Diabetes mellitus without complication ([place]): Most recent A1c not on record. Patient is taking Ozempic, Trulicity, Novolin at home -SSI, decrease Levemir 15 units twice a day and NovoLog 10 units 3 times daily with each meal - A1c 9.5 -Diabetic nurse recommendation appreciated  Hyponatremia: Most likely due to GI loss. Poor oral intake, nausea, vomiting and diarrhea.  -Low TSH. low Free T3 and normal T4 - likely euthyroid sick -stop fluids as Na 140  Nausea vomiting and diarrhea: Most likely due to COVID-19 infection -Supportive care -As needed Zofran -resolved, tolerating diet   Status is: Inpatient  Remains inpatient appropriate because:Inpatient level of care appropriate due to severity of illness   Dispo: The patient is from: Home              Anticipated d/c is to: Home              Anticipated d/c date is: 1 day              Patient currently is not medically stable to d/c.     DVT prophylaxis: Lovenox Family Communication: discussed with patient   All the records are reviewed and case discussed with Care Management/Social Worker. Management plans discussed with the  patient, nursing and they are in agreement.  CODE STATUS: Full Code  TOTAL TIME TAKING CARE OF THIS PATIENT: 35 minutes.   More than 50% of the time was spent in counseling/coordination of care: YES  POSSIBLE D/C IN 1 DAYS, DEPENDING ON CLINICAL CONDITION.   Max Sane M.D on 12/02/2019 at 3:07 PM  [facility]   CC: Primary care physician; System, Pcp Not In  Note: This dictation was prepared with Dragon dictation along with smaller phrase technology. Any transcriptional errors that result from this process are unintentional.

## 2019-12-02 NOTE — Evaluation (Signed)
Physical Therapy Evaluation Patient Details Name: Brittany Morris MRN: [id number] DOB: [birth year] Today's Date: 12/02/2019   History of Present Illness  presented to ER secondary to generalized weakness, SOB, nausea/vomiting and diarrhea; admitted for management of acute respiratory failure related to COVID-19 PNA.  Clinical Impression  Upon evaluation, patient alert and oriented; follows commands, eager for OOB activities as able.  Bilat UE/LE strength and ROM grossly symmetrical and WFL; no focal weakness appreciated.  Able to complete bed mobility with mod indep; sit/stand, basic transfers and gait (80') without assist device, cga/close sup.  Demonstrates reciprocal stepping pattern with fair step height/length; decreased cadence, mild higher level balance deficits (improved throughout gait distance).  Good safety awareness/insight.  Minimal/no SOB with exertion; sats >90% on RA throughout session. Mild HR elevation to 120s with dynamic balance and functional activities at the sink; recovers baseline 100-110 with intermittent rest periods. Would benefit from skilled PT to address above deficits and promote optimal return to PLOF.;d Recommend transition to HHPT upon discharge from acute hospitalization.     Follow Up Recommendations Home health PT    Equipment Recommendations       Recommendations for Other Services       Precautions / Restrictions Precautions Precautions: Fall Restrictions Weight Bearing Restrictions: No      Mobility  Bed Mobility Overal bed mobility: Modified Independent                Transfers Overall transfer level: Needs assistance   Transfers: Sit to/from Stand Sit to Stand: Supervision            Ambulation/Gait Ambulation/Gait assistance: Min guard;Supervision Gait Distance (Feet): 80 Feet Assistive device: None       General Gait Details: reciprocal stepping pattern with fair step height/length; decreased cadence, mild higher  level balance deficits (improved throughout gait distance).  Good safety awareness/insight.  Minimal/no SOB with exertion; sats >90% on RA throughout session.  Stairs            Wheelchair Mobility    Modified Rankin (Stroke Patients Only)       Balance Overall balance assessment: Needs assistance Sitting-balance support: No upper extremity supported;Feet supported Sitting balance-Leahy Scale: Good     Standing balance support: No upper extremity supported Standing balance-Leahy Scale: Fair                               Pertinent Vitals/Pain Pain Assessment: No/denies pain    Home Living Family/patient expects to be discharged to:: Private residence Living Arrangements: Alone Available Help at Discharge: Family Type of Home: House       Home Layout: One level Home Equipment: None      Prior Function Level of Independence: Independent               Hand Dominance        Extremity/Trunk Assessment   Upper Extremity Assessment Upper Extremity Assessment: Overall WFL for tasks assessed(grossly 4+/5 throughout)    Lower Extremity Assessment Lower Extremity Assessment: Overall WFL for tasks assessed(grossly 4+/5 throughout)       Communication   Communication: No difficulties  Cognition Arousal/Alertness: Awake/alert Behavior During Therapy: WFL for tasks assessed/performed;Anxious Overall Cognitive Status: Within Functional Limits for tasks assessed  General Comments      Exercises Other Exercises Other Exercises: Toilet transfer, ambulatory without assist device, sup; sit/stand from standard toilet without assist device, sup/mod indep; standing balance without assist device for hand hygiene at sink, close sup Other Exercises: Standing balance at sink to wash hair, complete sponge bath and oral care, sup/mod indep.  Good functional reach, good safety/awareness, fair/good  dynamic balance. Mild HR elevation to 120s during activity, but recovers with intermittent rest periods; sats >90% on RA throughout   Assessment/Plan    PT Assessment Patient needs continued PT services  PT Problem List Decreased activity tolerance;Decreased balance;Decreased mobility;Decreased safety awareness;Cardiopulmonary status limiting activity       PT Treatment Interventions DME instruction;Gait training;Functional mobility training;Therapeutic activities;Therapeutic exercise;Balance training;Cognitive remediation;Patient/family education    PT Goals (Current goals can be found in the Care Plan section)  Acute Rehab PT Goals Patient Stated Goal: to return home PT Goal Formulation: With patient Time For Goal Achievement: 12/16/19 Potential to Achieve Goals: Good    Frequency Min 2X/week   Barriers to discharge        Co-evaluation               AM-PAC PT "6 Clicks" Mobility  Outcome Measure Help needed turning from your back to your side while in a flat bed without using bedrails?: None Help needed moving from lying on your back to sitting on the side of a flat bed without using bedrails?: None Help needed moving to and from a bed to a chair (including a wheelchair)?: None Help needed standing up from a chair using your arms (e.g., wheelchair or bedside chair)?: None Help needed to walk in hospital room?: None Help needed climbing 3-5 steps with a railing? : A Little 6 Click Score: 23    End of Session Equipment Utilized During Treatment: Gait belt Activity Tolerance: Patient tolerated treatment well Patient left: in chair;with call bell/phone within reach Nurse Communication: Mobility status PT Visit Diagnosis: Muscle weakness (generalized) (M62.81);Difficulty in walking, not elsewhere classified (R26.2)    Time: 3428-7681 PT Time Calculation (min) (ACUTE ONLY): 45 min   Charges:   PT Evaluation $PT Eval Moderate Complexity: 1 Mod PT Treatments $Gait  Training: 8-22 mins $Therapeutic Activity: 23-37 mins       Brittany Morris H. Manson Passey, PT, DPT, NCS 12/02/19, 4:39 PM [phone]

## 2019-12-02 NOTE — Progress Notes (Signed)
Called into room by patient related to her having pain. Medicated per prn orders. Noted that lower legs were jerking constantly as in tremor or clonic seizure activity. Pt awake and aware and carried on a full conversation. She states this is related to her magnesium pills that have been stopped since she has been in hospital. Notified NP of above event. New orders were given to restart magnesium. Continue to monitor.

## 2019-12-03 LAB — COMPREHENSIVE METABOLIC PANEL
ALT: 23 U/L (ref 0–44)
AST: 21 U/L (ref 15–41)
Albumin: 3.5 g/dL (ref 3.5–5.0)
Alkaline Phosphatase: 63 U/L (ref 38–126)
Anion gap: 11 (ref 5–15)
BUN: 14 mg/dL (ref 6–20)
CO2: 31 mmol/L (ref 22–32)
Calcium: 8.9 mg/dL (ref 8.9–10.3)
Chloride: 95 mmol/L — ABNORMAL LOW (ref 98–111)
Creatinine, Ser: 0.72 mg/dL (ref 0.44–1.00)
GFR calc Af Amer: >60 mL/min (ref >60)
GFR calc non Af Amer: >60 mL/min (ref >60)
Glucose, Bld: 305 mg/dL — ABNORMAL HIGH (ref 70–99)
Potassium: 3.3 mmol/L — ABNORMAL LOW (ref 3.5–5.1)
Sodium: 137 mmol/L (ref 135–145)
Total Bilirubin: 0.8 mg/dL (ref 0.3–1.2)
Total Protein: 6.7 g/dL (ref 6.5–8.1)

## 2019-12-03 LAB — CBC WITH DIFFERENTIAL/PLATELET
Abs Immature Granulocytes: 0.03 10*3/uL (ref 0.00–0.07)
Basophils Absolute: 0 10*3/uL (ref 0.0–0.1)
Basophils Relative: 0 %
Eosinophils Absolute: 0 10*3/uL (ref 0.0–0.5)
Eosinophils Relative: 0 %
HCT: 41.4 % (ref 36.0–46.0)
Hemoglobin: 14 g/dL (ref 12.0–15.0)
Immature Granulocytes: 1 %
Lymphocytes Relative: 23 %
Lymphs Abs: 1.4 10*3/uL (ref 0.7–4.0)
MCH: 28.2 pg (ref 26.0–34.0)
MCHC: 33.8 g/dL (ref 30.0–36.0)
MCV: 83.3 fL (ref 80.0–100.0)
Monocytes Absolute: 0.5 10*3/uL (ref 0.1–1.0)
Monocytes Relative: 9 %
Neutro Abs: 4 10*3/uL (ref 1.7–7.7)
Neutrophils Relative %: 67 %
Platelets: 243 10*3/uL (ref 150–400)
RBC: 4.97 MIL/uL (ref 3.87–5.11)
RDW: 12.8 % (ref 11.5–15.5)
WBC: 5.9 10*3/uL (ref 4.0–10.5)
nRBC: 0 % (ref 0.0–0.2)

## 2019-12-03 LAB — FIBRIN DERIVATIVES D-DIMER (ARMC ONLY): Fibrin derivatives D-dimer (ARMC): 668.24 ng/mL (FEU) — ABNORMAL HIGH (ref 0.00–499.00)

## 2019-12-03 LAB — C-REACTIVE PROTEIN: CRP: 2.3 mg/dL — ABNORMAL HIGH (ref <1.0)

## 2019-12-03 LAB — GLUCOSE, CAPILLARY
Glucose-Capillary: 242 mg/dL — ABNORMAL HIGH (ref 70–99)
Glucose-Capillary: 311 mg/dL — ABNORMAL HIGH (ref 70–99)

## 2019-12-03 LAB — FERRITIN: Ferritin: 285 ng/mL (ref 11–307)

## 2019-12-03 MED ORDER — INSULIN DETEMIR 100 UNIT/ML ~~LOC~~ SOLN
25.0000 [IU] | Freq: Two times a day (BID) | SUBCUTANEOUS | Status: DC
  Administered 2019-12-03: 25 [IU] via SUBCUTANEOUS
  Filled 2019-12-03 (×3): qty 0.25

## 2019-12-03 MED ORDER — ALBUTEROL SULFATE HFA 108 (90 BASE) MCG/ACT IN AERS: 2.0000 | INHALATION_SPRAY | RESPIRATORY_TRACT | 0 refills | Status: DC | PRN

## 2019-12-03 MED ORDER — ZINC SULFATE 220 (50 ZN) MG PO CAPS: 220.0000 mg | ORAL_CAPSULE | Freq: Every day | ORAL | 0 refills | Status: DC

## 2019-12-03 NOTE — Discharge Instructions (Signed)
COVID-19 COVID-19 is a respiratory infection that is caused by a virus called severe acute respiratory syndrome coronavirus 2 (SARS-CoV-2). The disease is also known as coronavirus disease or novel coronavirus. In some people, the virus may not cause any symptoms. In others, it may cause a serious infection. The infection can get worse quickly and can lead to complications, such as:  Pneumonia, or infection of the lungs.  Acute respiratory distress syndrome or ARDS. This is a condition in which fluid build-up in the lungs prevents the lungs from filling with air and passing oxygen into the blood.  Acute respiratory failure. This is a condition in which there is not enough oxygen passing from the lungs to the body or when carbon dioxide is not passing from the lungs out of the body.  Sepsis or septic shock. This is a serious bodily reaction to an infection.  Blood clotting problems.  Secondary infections due to bacteria or fungus.  Organ failure. This is when your body's organs stop working. The virus that causes COVID-19 is contagious. This means that it can spread from person to person through droplets from coughs and sneezes (respiratory secretions). What are the causes? This illness is caused by a virus. You may catch the virus by:  Breathing in droplets from an infected person. Droplets can be spread by a person breathing, speaking, singing, coughing, or sneezing.  Touching something, like a table or a doorknob, that was exposed to the virus (contaminated) and then touching your mouth, nose, or eyes. What increases the risk? Risk for infection You are more likely to be infected with this virus if you:  Are within 6 feet (2 meters) of a person with COVID-19.  Provide care for or live with a person who is infected with COVID-19.  Spend time in crowded indoor spaces or live in shared housing. Risk for serious illness You are more likely to become seriously ill from the virus if  you:  Are 51 years of age or older. The higher your age, the more you are at risk for serious illness.  Live in a nursing home or long-term care facility.  Have cancer.  Have a long-term (chronic) disease such as: ? Chronic lung disease, including chronic obstructive pulmonary disease or asthma. ? A long-term disease that lowers your body's ability to fight infection (immunocompromised). ? Heart disease, including heart failure, a condition in which the arteries that lead to the heart become narrow or blocked (coronary artery disease), a disease which makes the heart muscle thick, weak, or stiff (cardiomyopathy). ? Diabetes. ? Chronic kidney disease. ? Sickle cell disease, a condition in which red blood cells have an abnormal "sickle" shape. ? Liver disease.  Are obese. What are the signs or symptoms? Symptoms of this condition can range from mild to severe. Symptoms may appear any time from 2 to 14 days after being exposed to the virus. They include:  A fever or chills.  A cough.  Difficulty breathing.  Headaches, body aches, or muscle aches.  Runny or stuffy (congested) nose.  A sore throat.  New loss of taste or smell. Some people may also have stomach problems, such as nausea, vomiting, or diarrhea. Other people may not have any symptoms of COVID-19. How is this diagnosed? This condition may be diagnosed based on:  Your signs and symptoms, especially if: ? You live in an area with a COVID-19 outbreak. ? You recently traveled to or from an area where the virus is common. ? You  provide care for or live with a person who was diagnosed with COVID-19. ? You were exposed to a person who was diagnosed with COVID-19.  A physical exam.  Lab tests, which may include: ? Taking a sample of fluid from the back of your nose and throat (nasopharyngeal fluid), your nose, or your throat using a swab. ? A sample of mucus from your lungs (sputum). ? Blood tests.  Imaging tests,  which may include, X-rays, CT scan, or ultrasound. How is this treated? At present, there is no medicine to treat COVID-19. Medicines that treat other diseases are being used on a trial basis to see if they are effective against COVID-19. Your health care provider will talk with you about ways to treat your symptoms. For most people, the infection is mild and can be managed at home with rest, fluids, and over-the-counter medicines. Treatment for a serious infection usually takes places in a hospital intensive care unit (ICU). It may include one or more of the following treatments. These treatments are given until your symptoms improve.  Receiving fluids and medicines through an IV.  Supplemental oxygen. Extra oxygen is given through a tube in the nose, a face mask, or a hood.  Positioning you to lie on your stomach (prone position). This makes it easier for oxygen to get into the lungs.  Continuous positive airway pressure (CPAP) or bi-level positive airway pressure (BPAP) machine. This treatment uses mild air pressure to keep the airways open. A tube that is connected to a motor delivers oxygen to the body.  Ventilator. This treatment moves air into and out of the lungs by using a tube that is placed in your windpipe.  Tracheostomy. This is a procedure to create a hole in the neck so that a breathing tube can be inserted.  Extracorporeal membrane oxygenation (ECMO). This procedure gives the lungs a chance to recover by taking over the functions of the heart and lungs. It supplies oxygen to the body and removes carbon dioxide. Follow these instructions at home: Lifestyle  If you are sick, stay home except to get medical care. Your health care provider will tell you how long to stay home. Call your health care provider before you go for medical care.  Rest at home as told by your health care provider.  Do not use any products that contain nicotine or tobacco, such as cigarettes,  e-cigarettes, and chewing tobacco. If you need help quitting, ask your health care provider.  Return to your normal activities as told by your health care provider. Ask your health care provider what activities are safe for you. General instructions  Take over-the-counter and prescription medicines only as told by your health care provider.  Drink enough fluid to keep your urine pale yellow.  Keep all follow-up visits as told by your health care provider. This is important. How is this prevented?  There is no vaccine to help prevent COVID-19 infection. However, there are steps you can take to protect yourself and others from this virus. To protect yourself:   Do not travel to areas where COVID-19 is a risk. The areas where COVID-19 is reported change often. To identify high-risk areas and travel restrictions, check the CDC travel website: FatFares.com.br  If you live in, or must travel to, an area where COVID-19 is a risk, take precautions to avoid infection. ? Stay away from people who are sick. ? Wash your hands often with soap and water for 20 seconds. If soap and water  are not available, use an alcohol-based hand sanitizer. ? Avoid touching your mouth, face, eyes, or nose. ? Avoid going out in public, follow guidance from your state and local health authorities. ? If you must go out in public, wear a cloth face covering or face mask. Make sure your mask covers your nose and mouth. ? Avoid crowded indoor spaces. Stay at least 6 feet (2 meters) away from others. ? Disinfect objects and surfaces that are frequently touched every day. This may include:  Counters and tables.  Doorknobs and light switches.  Sinks and faucets.  Electronics, such as phones, remote controls, keyboards, computers, and tablets. To protect others: If you have symptoms of COVID-19, take steps to prevent the virus from spreading to others.  If you think you have a COVID-19 infection, contact  your health care provider right away. Tell your health care team that you think you may have a COVID-19 infection.  Stay home. Leave your house only to seek medical care. Do not use public transport.  Do not travel while you are sick.  Wash your hands often with soap and water for 20 seconds. If soap and water are not available, use alcohol-based hand sanitizer.  Stay away from other members of your household. Let healthy household members care for children and pets, if possible. If you have to care for children or pets, wash your hands often and wear a mask. If possible, stay in your own room, separate from others. Use a different bathroom.  Make sure that all people in your household wash their hands well and often.  Cough or sneeze into a tissue or your sleeve or elbow. Do not cough or sneeze into your hand or into the air.  Wear a cloth face covering or face mask. Make sure your mask covers your nose and mouth. Where to find more information  Centers for Disease Control and Prevention: PurpleGadgets.be  World Health Organization: https://www.castaneda.info/ Contact a health care provider if:  You live in or have traveled to an area where COVID-19 is a risk and you have symptoms of the infection.  You have had contact with someone who has COVID-19 and you have symptoms of the infection. Get help right away if:  You have trouble breathing.  You have pain or pressure in your chest.  You have confusion.  You have bluish lips and fingernails.  You have difficulty waking from sleep.  You have symptoms that get worse. These symptoms may represent a serious problem that is an emergency. Do not wait to see if the symptoms will go away. Get medical help right away. Call your local emergency services (911 in the [place] Do not drive yourself to the hospital. Let the emergency medical personnel know if you think you have  COVID-19. Summary  COVID-19 is a respiratory infection that is caused by a virus. It is also known as coronavirus disease or novel coronavirus. It can cause serious infections, such as pneumonia, acute respiratory distress syndrome, acute respiratory failure, or sepsis.  The virus that causes COVID-19 is contagious. This means that it can spread from person to person through droplets from breathing, speaking, singing, coughing, or sneezing.  You are more likely to develop a serious illness if you are 70 years of age or older, have a weak immune system, live in a nursing home, or have chronic disease.  There is no medicine to treat COVID-19. Your health care provider will talk with you about ways to treat your symptoms.  Take steps to protect yourself and others from infection. Wash your hands often and disinfect objects and surfaces that are frequently touched every day. Stay away from people who are sick and wear a mask if you are sick. This information is not intended to replace advice given to you by your health care provider. Make sure you discuss any questions you have with your health care provider. Document Revised: 06/05/2019 Document Reviewed: 09/11/2018 Elsevier Patient Education  2020 [place].  COVID-19: How to Protect Yourself and Others Know how it spreads  There is currently no vaccine to prevent coronavirus disease 2019 (COVID-19).  The best way to prevent illness is to avoid being exposed to this virus.  The virus is thought to spread mainly from person-to-person. ? Between people who are in close contact with one another (within about 6 feet). ? Through respiratory droplets produced when an infected person coughs, sneezes or talks. ? These droplets can land in the mouths or noses of people who are nearby or possibly be inhaled into the lungs. ? COVID-19 may be spread by people who are not showing symptoms. Everyone should Clean your hands often  Wash your hands  often with soap and water for at least 20 seconds especially after you have been in a public place, or after blowing your nose, coughing, or sneezing.  If soap and water are not readily available, use a hand sanitizer that contains at least 60% alcohol. Cover all surfaces of your hands and rub them together until they feel dry.  Avoid touching your eyes, nose, and mouth with unwashed hands. Avoid close contact  Limit contact with others as much as possible.  Avoid close contact with people who are sick.  Put distance between yourself and other people. ? Remember that some people without symptoms may be able to spread virus. ? This is especially important for people who are at higher risk of getting very GainPain.com.cy Cover your mouth and nose with a mask when around others  You could spread COVID-19 to others even if you do not feel sick.  Everyone should wear a mask in public settings and when around people not living in their household, especially when social distancing is difficult to maintain. ? Masks should not be placed on young children under age 2, anyone who has trouble breathing, or is unconscious, incapacitated or otherwise unable to remove the mask without assistance.  The mask is meant to protect other people in case you are infected.  Do NOT use a facemask meant for a Dietitian.  Continue to keep about 6 feet between yourself and others. The mask is not a substitute for social distancing. Cover coughs and sneezes  Always cover your mouth and nose with a tissue when you cough or sneeze or use the inside of your elbow.  Throw used tissues in the trash.  Immediately wash your hands with soap and water for at least 20 seconds. If soap and water are not readily available, clean your hands with a hand sanitizer that contains at least 60% alcohol. Clean and disinfect  Clean AND disinfect  frequently touched surfaces daily. This includes tables, doorknobs, light switches, countertops, handles, desks, phones, keyboards, toilets, faucets, and sinks. RackRewards.fr  If surfaces are dirty, clean them: Use detergent or soap and water prior to disinfection.  Then, use a household disinfectant. You can see a list of EPA-registered household disinfectants here. michellinders.com 04/22/2019 This information is not intended to replace advice given to you by  your health care provider. Make sure you discuss any questions you have with your health care provider. Document Revised: 04/30/2019 Document Reviewed: 02/26/2019 Elsevier Patient Education  [place].   COVID-19 Frequently Asked Questions COVID-19 (coronavirus disease) is an infection that is caused by a large family of viruses. Some viruses cause illness in people and others cause illness in animals like camels, cats, and bats. In some cases, the viruses that cause illness in animals can spread to humans. Where did the coronavirus come from? In December 2019, [place] told the [facility]) of several cases of lung disease (human respiratory illness). These cases were linked to an open seafood and livestock market in the city of [place]. The link to the seafood and livestock market suggests that the virus may have spread from animals to humans. However, since that first outbreak in December, the virus has also been shown to spread from person to person. What is the name of the disease and the virus? Disease name Early on, this disease was called novel coronavirus. This is because scientists determined that the disease was caused by a new (novel) respiratory virus. The World Health Organization [facility]) has now named the disease COVID-19, or coronavirus disease. Virus name The virus that causes the disease is called severe acute respiratory syndrome  coronavirus 2 (SARS-CoV-2). More information on disease and virus naming World Health Organization [facility]): www.who.int/emergencies/diseases/novel-coronavirus-2019/technical-guidance/naming-the-coronavirus-disease-(covid-2019)-and-the-virus-that-causes-it Who is at risk for complications from coronavirus disease? Some people may be at higher risk for complications from coronavirus disease. This includes older adults and people who have chronic diseases, such as heart disease, diabetes, and lung disease. If you are at higher risk for complications, take these extra precautions:  Stay home as much as possible.  Avoid social gatherings and travel.  Avoid close contact with others. Stay at least 6 ft (2 m) away from others, if possible.  Wash your hands often with soap and water for at least 20 seconds.  Avoid touching your face, mouth, nose, or eyes.  Keep supplies on hand at home, such as food, medicine, and cleaning supplies.  If you must go out in public, wear a cloth face covering or face mask. Make sure your mask covers your nose and mouth. How does coronavirus disease spread? The virus that causes coronavirus disease spreads easily from person to person (is contagious). You may catch the virus by:  Breathing in droplets from an infected person. Droplets can be spread by a person breathing, speaking, singing, coughing, or sneezing.  Touching something, like a table or a doorknob, that was exposed to the virus (contaminated) and then touching your mouth, nose, or eyes. Can I get the virus from touching surfaces or objects? There is still a lot that we do not know about the virus that causes coronavirus disease. Scientists are basing a lot of information on what they know about similar viruses, such as:  Viruses cannot generally survive on surfaces for long. They need a human body (host) to survive.  It is more likely that the virus is spread by close contact with people who are sick  (direct contact), such as through: ? Shaking hands or hugging. ? Breathing in respiratory droplets that travel through the air. Droplets can be spread by a person breathing, speaking, singing, coughing, or sneezing.  It is less likely that the virus is spread when a person touches a surface or object that has the virus on it (indirect contact). The virus may be able to enter  the body if the person touches a surface or object and then touches his or her face, eyes, nose, or mouth. Can a person spread the virus without having symptoms of the disease? It may be possible for the virus to spread before a person has symptoms of the disease, but this is most likely not the main way the virus is spreading. It is more likely for the virus to spread by being in close contact with people who are sick and breathing in the respiratory droplets spread by a person breathing, speaking, singing, coughing, or sneezing. What are the symptoms of coronavirus disease? Symptoms vary from person to person and can range from mild to severe. Symptoms may include:  Fever or chills.  Cough.  Difficulty breathing or feeling short of breath.  Headaches, body aches, or muscle aches.  Runny or stuffy (congested) nose.  Sore throat.  New loss of taste or smell.  Nausea, vomiting, or diarrhea. These symptoms can appear anywhere from 2 to 14 days after you have been exposed to the virus. Some people may not have any symptoms. If you develop symptoms, call your health care provider. People with severe symptoms may need hospital care. Should I be tested for this virus? Your health care provider will decide whether to test you based on your symptoms, history of exposure, and your risk factors. How does a health care provider test for this virus? Health care providers will collect samples to send for testing. Samples may include:  Taking a swab of fluid from the back of your nose and throat, your nose, or your  throat.  Taking fluid from the lungs by having you cough up mucus (sputum) into a sterile cup.  Taking a blood sample. Is there a treatment or vaccine for this virus? Currently, there is no vaccine to prevent coronavirus disease. Also, there are no medicines like antibiotics or antivirals to treat the virus. A person who becomes sick is given supportive care, which means rest and fluids. A person may also relieve his or her symptoms by using over-the-counter medicines that treat sneezing, coughing, and runny nose. These are the same medicines that a person takes for the common cold. If you develop symptoms, call your health care provider. People with severe symptoms may need hospital care. What can I do to protect myself and my family from this virus?     You can protect yourself and your family by taking the same actions that you would take to prevent the spread of other viruses. Take the following actions:  Wash your hands often with soap and water for at least 20 seconds. If soap and water are not available, use alcohol-based hand sanitizer.  Avoid touching your face, mouth, nose, or eyes.  Cough or sneeze into a tissue, sleeve, or elbow. Do not cough or sneeze into your hand or the air. ? If you cough or sneeze into a tissue, throw it away immediately and wash your hands.  Disinfect objects and surfaces that you frequently touch every day.  Stay away from people who are sick.  Avoid going out in public, follow guidance from your state and local health authorities.  Avoid crowded indoor spaces. Stay at least 6 ft (2 m) away from others.  If you must go out in public, wear a cloth face covering or face mask. Make sure your mask covers your nose and mouth.  Stay home if you are sick, except to get medical care. Call your health care provider  before you get medical care. Your health care provider will tell you how long to stay home.  Make sure your vaccines are up to date. Ask your  health care provider what vaccines you need. What should I do if I need to travel? Follow travel recommendations from your local health authority, the CDC, and WHO. Travel information and advice  Centers for Disease Control and Prevention (CDC): BodyEditor.hu  World Health Organization [facility]): ThirdIncome.ca Know the risks and take action to protect your health  You are at higher risk of getting coronavirus disease if you are traveling to areas with an outbreak or if you are exposed to travelers from areas with an outbreak.  Wash your hands often and practice good hygiene to lower the risk of catching or spreading the virus. What should I do if I am sick? General instructions to stop the spread of infection  Wash your hands often with soap and water for at least 20 seconds. If soap and water are not available, use alcohol-based hand sanitizer.  Cough or sneeze into a tissue, sleeve, or elbow. Do not cough or sneeze into your hand or the air.  If you cough or sneeze into a tissue, throw it away immediately and wash your hands.  Stay home unless you must get medical care. Call your health care provider or local health authority before you get medical care.  Avoid public areas. Do not take public transportation, if possible.  If you can, wear a mask if you must go out of the house or if you are in close contact with someone who is not sick. Make sure your mask covers your nose and mouth. Keep your home clean  Disinfect objects and surfaces that are frequently touched every day. This may include: ? Counters and tables. ? Doorknobs and light switches. ? Sinks and faucets. ? Electronics such as phones, remote controls, keyboards, computers, and tablets.  Wash dishes in hot, soapy water or use a dishwasher. Air-dry your dishes.  Wash laundry in hot water. Prevent infecting other household  members  Let healthy household members care for children and pets, if possible. If you have to care for children or pets, wash your hands often and wear a mask.  Sleep in a different bedroom or bed, if possible.  Do not share personal items, such as razors, toothbrushes, deodorant, combs, brushes, towels, and washcloths. Where to find more information Centers for Disease Control and Prevention (CDC)  Information and news updates: https://www.butler-gonzalez.com/ World Health Organization [facility])  Information and news updates: MissExecutive.com.ee  Coronavirus health topic: https://www.castaneda.info/  Questions and answers on COVID-19: OpportunityDebt.at  Global tracker: who.sprinklr.com American Academy of Pediatrics (AAP)  Information for families: www.healthychildren.org/English/health-issues/conditions/chest-lungs/Pages/2019-Novel-Coronavirus.aspx The coronavirus situation is changing rapidly. Check your local health authority website or the CDC and [facility] websites for updates and news. When should I contact a health care provider?  Contact your health care provider if you have symptoms of an infection, such as fever or cough, and you: ? Have been near anyone who is known to have coronavirus disease. ? Have come into contact with a person who is suspected to have coronavirus disease. ? Have traveled to an area where there is an outbreak of COVID-19. When should I get emergency medical care?  Get help right away by calling your local emergency services (911 in the [place] if you have: ? Trouble breathing. ? Pain or pressure in your chest. ? Confusion. ? Blue-tinged lips and fingernails. ? Difficulty waking from sleep. ?  Symptoms that get worse. Let the emergency medical personnel know if you think you have coronavirus disease. Summary  A new respiratory virus is spreading from person to person and  causing COVID-19 (coronavirus disease).  The virus that causes COVID-19 appears to spread easily. It spreads from one person to another through droplets from breathing, speaking, singing, coughing, or sneezing.  Older adults and those with chronic diseases are at higher risk of disease. If you are at higher risk for complications, take extra precautions.  There is currently no vaccine to prevent coronavirus disease. There are no medicines, such as antibiotics or antivirals, to treat the virus.  You can protect yourself and your family by washing your hands often, avoiding touching your face, and covering your coughs and sneezes. This information is not intended to replace advice given to you by your health care provider. Make sure you discuss any questions you have with your health care provider. Document Revised: 06/05/2019 Document Reviewed: 12/02/2018 Elsevier Patient Education  2020 Elsevier Inc.  COVID-19: Quarantine vs. Isolation QUARANTINE keeps someone who was in close contact with someone who has COVID-19 away from others. If you had close contact with a person who has COVID-19  Stay home until 14 days after your last contact.  Check your temperature twice a day and watch for symptoms of COVID-19.  If possible, stay away from people who are at higher-risk for getting very sick from COVID-19. ISOLATION keeps someone who is sick or tested positive for COVID-19 without symptoms away from others, even in their own home. If you are sick and think or know you have COVID-19  Stay home until after ? At least 10 days since symptoms first appeared and ? At least 24 hours with no fever without fever-reducing medication and ? Symptoms have improved If you tested positive for COVID-19 but do not have symptoms  Stay home until after ? 10 days have passed since your positive test If you live with others, stay in a specific "sick room" or area and away from other people or animals,  including pets. Use a separate bathroom, if available. [email or web address] 03/09/2019 This information is not intended to replace advice given to you by your health care provider. Make sure you discuss any questions you have with your health care provider. Document Revised: 07/23/2019 Document Reviewed: 07/23/2019 Elsevier Patient Education  2020 Elsevier Inc.  10 Things You Can Do to Manage Your COVID-19 Symptoms at Home If you have possible or confirmed COVID-19: 1. Stay home from work and school. And stay away from other public places. If you must go out, avoid using any kind of public transportation, ridesharing, or taxis. 2. Monitor your symptoms carefully. If your symptoms get worse, call your healthcare provider immediately. 3. Get rest and stay hydrated. 4. If you have a medical appointment, call the healthcare provider ahead of time and tell them that you have or may have COVID-19. 5. For medical emergencies, call 911 and notify the dispatch personnel that you have or may have COVID-19. 6. Cover your cough and sneezes with a tissue or use the inside of your elbow. 7. Wash your hands often with soap and water for at least 20 seconds or clean your hands with an alcohol-based hand sanitizer that contains at least 60% alcohol. 8. As much as possible, stay in a specific room and away from other people in your home. Also, you should use a separate bathroom, if available. If you need to be around other people in  or outside of the home, wear a mask. 9. Avoid sharing personal items with other people in your household, like dishes, towels, and bedding. 10. Clean all surfaces that are touched often, like counters, tabletops, and doorknobs. Use household cleaning sprays or wipes according to the label instructions. [email or web address] 02/18/2019 This information is not intended to replace advice given to you by your health care provider. Make sure you discuss any questions you have with your  health care provider. Document Revised: 07/23/2019 Document Reviewed: 07/23/2019 Elsevier Patient Education  2020 Elsevier Inc.    Person Under Monitoring Name: Brittany Morris  Location: [address] [place] [address]   CORONAVIRUS DISEASE 2019 (COVID-19) Guidance for Persons Under Investigation You are being tested for the virus that causes coronavirus disease 2019 (COVID-19). Public health actions are necessary to ensure protection of your health and the health of others, and to prevent further spread of infection. COVID-19 is caused by a virus that can cause symptoms, such as fever, cough, and shortness of breath. The primary transmission from person to person is by coughing or sneezing. On September 18, 2018, the World Health Organization announced a [employer] Emergency of International Concern and on September 19, 2018 the [place] Department of Health and Human Services declared a public health emergency. If the virus that causesCOVID-19 spreads in the community, it could have severe public health consequences.  As a person under investigation for COVID-19, the [employer] of [facility] and [employer] Division of [employer] advises you to adhere to the following guidance until your test results are reported to you. If your test result is positive, you will receive additional information from your provider and your local health department at that time.   Remain at home until you are cleared by your health provider or public health authorities.   Keep a log of visitors to your home using the form provided. Any visitors to your home must be aware of your isolation status.  If you plan to move to a new address or leave the county, notify the local health department in your county.  Call a doctor or seek care if you have an urgent medical need. Before seeking medical care, call ahead and get instructions from the provider before arriving at the medical office,  clinic or hospital. Notify them that you are being tested for the virus that causes COVID-19 so arrangements can be made, as necessary, to prevent transmission to others in the healthcare setting. Next, notify the local health department in your county.  If a medical emergency arises and you need to call 911, inform the first responders that you are being tested for the virus that causes COVID-19. Next, notify the local health department in your county.  Adhere to all guidance set forth by the [facility] Division of [employer] for [facility] of patients that is based on guidance from the Center for Disease Control and Prevention with suspected or confirmed COVID-19. It is provided with this guidance for Persons Under Investigation.  Your health and the health of our community are our top priorities. Public Health officials remain available to provide assistance and counseling to you about COVID-19 and compliance with this guidance.  Provider: ____________________________________________________________ Date: ______/_____/_________  By signing below, you acknowledge that you have read and agree to comply with this Guidance for Persons Under Investigation. ______________________________________________________________ Date: ______/_____/_________  WHO DO I CALL? You can find a list of local health departments here: [email or web address]  Department: ____________________________________________________________________ Contact Name: ________________________________________________________________________ Telephone: ___________________________________________________________________________  Marice Potter, [place], Communicable Disease Branch COVID-19 Guidance for Persons Under Investigation October 25, 2018   Person Under Monitoring Name: Brittany Morris  Location: [place]  [address]   Infection Prevention Recommendations for Individuals Confirmed to have, or Being Evaluated for, 2019 Novel Coronavirus (COVID-19) Infection Who Receive Care at Home  Individuals who are confirmed to have, or are being evaluated for, COVID-19 should follow the prevention steps below until a healthcare provider or local or state health department says they can return to normal activities.  Stay home except to get medical care You should restrict activities outside your home, except for getting medical care. Do not go to work, school, or public areas, and do not use public transportation or taxis.  Call ahead before visiting your doctor Before your medical appointment, call the healthcare provider and tell them that you have, or are being evaluated for, COVID-19 infection. This will help the healthcare providers office take steps to keep other people from getting infected. Ask your healthcare provider to call the local or state health department.  Monitor your symptoms Seek prompt medical attention if your illness is worsening (e.g., difficulty breathing). Before going to your medical appointment, call the healthcare provider and tell them that you have, or are being evaluated for, COVID-19 infection. Ask your healthcare provider to call the local or state health department.  Wear a facemask You should wear a facemask that covers your nose and mouth when you are in the same room with other people and when you visit a healthcare provider. People who live with or visit you should also wear a facemask while they are in the same room with you.  Separate yourself from other people in your home As much as possible, you should stay in a different room from other people in your home. Also, you should use a separate bathroom, if available.  Avoid sharing household items You should not share dishes, drinking glasses, cups, eating utensils, towels, bedding, or other items with other  people in your home. After using these items, you should wash them thoroughly with soap and water.  Cover your coughs and sneezes Cover your mouth and nose with a tissue when you cough or sneeze, or you can cough or sneeze into your sleeve. Throw used tissues in a lined trash can, and immediately wash your hands with soap and water for at least 20 seconds or use an alcohol-based hand rub.  Wash your [facility] your hands often and thoroughly with soap and water for at least 20 seconds. You can use an alcohol-based hand sanitizer if soap and water are not available and if your hands are not visibly dirty. Avoid touching your eyes, nose, and mouth with unwashed hands.   Prevention Steps for Caregivers and Household Members of Individuals Confirmed to have, or Being Evaluated for, COVID-19 Infection Being Cared for in the Home  If you live with, or provide care at home for, a person confirmed to have, or being evaluated for, COVID-19 infection please follow these guidelines to prevent infection:  Follow healthcare providers instructions Make sure that you understand and can help the patient follow any healthcare provider instructions for all care.  Provide for the patients basic needs You should help the patient with basic needs in the home and provide support for getting groceries, prescriptions, and other personal needs.  Monitor the patients symptoms If they  are getting sicker, call his or her medical provider and tell them that the patient has, or is being evaluated for, COVID-19 infection. This will help the healthcare providers office take steps to keep other people from getting infected. Ask the healthcare provider to call the local or state health department.  Limit the number of people who have contact with the patient  If possible, have only one caregiver for the patient.  Other household members should stay in another home or place of residence. If this is not  possible, they should stay  in another room, or be separated from the patient as much as possible. Use a separate bathroom, if available.  Restrict visitors who do not have an essential need to be in the home.  Keep older adults, very young children, and other sick people away from the patient Keep older adults, very young children, and those who have compromised immune systems or chronic health conditions away from the patient. This includes people with chronic heart, lung, or kidney conditions, diabetes, and cancer.  Ensure good ventilation Make sure that shared spaces in the home have good air flow, such as from an air conditioner or an opened window, weather permitting.  Wash your hands often  Wash your hands often and thoroughly with soap and water for at least 20 seconds. You can use an alcohol based hand sanitizer if soap and water are not available and if your hands are not visibly dirty.  Avoid touching your eyes, nose, and mouth with unwashed hands.  Use disposable paper towels to dry your hands. If not available, use dedicated cloth towels and replace them when they become wet.  Wear a facemask and gloves  Wear a disposable facemask at all times in the room and gloves when you touch or have contact with the patients blood, body fluids, and/or secretions or excretions, such as sweat, saliva, sputum, nasal mucus, vomit, urine, or feces.  Ensure the mask fits over your nose and mouth tightly, and do not touch it during use.  Throw out disposable facemasks and gloves after using them. Do not reuse.  Wash your hands immediately after removing your facemask and gloves.  If your personal clothing becomes contaminated, carefully remove clothing and launder. Wash your hands after handling contaminated clothing.  Place all used disposable facemasks, gloves, and other waste in a lined container before disposing them with other household waste.  Remove gloves and wash your hands  immediately after handling these items.  Do not share dishes, glasses, or other household items with the patient  Avoid sharing household items. You should not share dishes, drinking glasses, cups, eating utensils, towels, bedding, or other items with a patient who is confirmed to have, or being evaluated for, COVID-19 infection.  After the person uses these items, you should wash them thoroughly with soap and water.  Wash laundry thoroughly  Immediately remove and wash clothes or bedding that have blood, body fluids, and/or secretions or excretions, such as sweat, saliva, sputum, nasal mucus, vomit, urine, or feces, on them.  Wear gloves when handling laundry from the patient.  Read and follow directions on labels of laundry or clothing items and detergent. In general, wash and dry with the warmest temperatures recommended on the label.  Clean all areas the individual has used often  Clean all touchable surfaces, such as counters, tabletops, doorknobs, bathroom fixtures, toilets, phones, keyboards, tablets, and bedside tables, every day. Also, clean any surfaces that may have blood, body fluids, and/or  secretions or excretions on them.  Wear gloves when cleaning surfaces the patient has come in contact with.  Use a diluted bleach solution (e.g., dilute bleach with 1 part bleach and 10 parts water) or a household disinfectant with a label that says EPA-registered for coronaviruses. To make a bleach solution at home, add 1 tablespoon of bleach to 1 quart (4 cups) of water. For a larger supply, add  cup of bleach to 1 gallon (16 cups) of water.  Read labels of cleaning products and follow recommendations provided on product labels. Labels contain instructions for safe and effective use of the cleaning product including precautions you should take when applying the product, such as wearing gloves or eye protection and making sure you have good ventilation during use of the product.  Remove  gloves and wash hands immediately after cleaning.  Monitor yourself for signs and symptoms of illness Caregivers and household members are considered close contacts, should monitor their health, and will be asked to limit movement outside of the home to the extent possible. Follow the monitoring steps for close contacts listed on the symptom monitoring form.   ? If you have additional questions, contact your local health department or call the epidemiologist on call at [phone] (available 24/7). ? This guidance is subject to change. For the most up-to-date guidance from [facility], please refer to their website: TripMetro.huhttps://www.cdc.gov/coronavirus/2019-ncov/hcp/guidance-prevent-spread.html

## 2019-12-03 NOTE — TOC Transition Note (Signed)
Transition of Care [facility]) - CM/SW Discharge Note   Patient Details  Name: Brittany Morris MRN: [id number] Date of Birth: 12-20-1965  [facility] CM/SW Contact:  Elease Hashimoto, LCSW Phone Number: 12/03/2019, 10:03 AM   Clinical Narrative:   Have ordered home O2 and HHPT. Pt is aware and is feeling good about going home today. Informed her to wait for O2 to be delivered prior to leaving here. Bedside RN to go over DC instructions for home. Pt does have family who will be checking on and making sure her needs are met. She is independent with mobility just needs to work on her strengthening and endurance. She is very glad to be going home and getting out of the hospital.    Final next level of care: [place] Barriers to Discharge: Barriers Resolved   Patient Goals and CMS Choice        Discharge Placement                Patient to be transferred to facility by: family via car Name of family member notified: jessica Patient and family notified of of transfer: 12/03/19  Discharge Plan and Services                DME Arranged: Oxygen DME Agency: [employer] Date DME Agency Contacted: 12/03/19 Time DME Agency Contacted: 18 Representative spoke with at DME Agency: Leroy [place]: PT [place]: [place] Date [place]: 12/03/19 Time HH Agency Contacted: 1000 Representative spoke with at [place]: cory  Social Determinants of Health ([place]) Interventions     Readmission Risk Interventions No flowsheet data found.

## 2019-12-03 NOTE — Discharge Summary (Signed)
[place] [facility]   PATIENT NAME: Brittany Morris    MR#:  [id number]  DATE OF BIRTH:  [birth year]  DATE OF ADMISSION:  11/29/2019   ADMITTING PHYSICIAN: Lorretta Harp, MD  DATE OF DISCHARGE: 12/03/2019  3:54 PM  PRIMARY CARE PHYSICIAN: Madaline Guthrie, MD   ADMISSION DIAGNOSIS:  COVID-19 virus infection [U07.1] Pneumonia due to COVID-19 virus [U07.1, J12.82] DISCHARGE DIAGNOSIS:  Principal Problem:   COVID-19 virus infection Active Problems:   Hypertension   High cholesterol   Diabetes mellitus without complication (HCC)   Hyponatremia   Nausea vomiting and diarrhea   Acute respiratory failure with hypoxia (HCC)  SECONDARY DIAGNOSIS:   Past Medical History:  Diagnosis Date  . Diabetes mellitus without complication (HCC)   . High cholesterol   . Hypertension   . Nerve pain    HOSPITAL COURSE:  Brittany Morris a 54 y.o.femalewith medical history significant ofhypertension, hyperlipidemia, diabetes mellitus, GERD admitted for generalized weakness, shortness breath, nausea, vomiting, diarrhea.  Patient had a positive COVID-19 test on 11/29/2019. She has mild shortness breath and mild cough.She hasfever with temperature 100.1 in ED. She had generalized weakness,poor appetite, nausea, vomiting and diarrhea.Oxygen desaturation to 85% on room air, which improved to 94% on 2 L nasal cannula oxygen.  ED Course:WBC 4.5, lactic acid 1.3, sodium 127, renal function okay, temperature 100.1, blood pressure 142/86, tachycardia, RR 18, chest x-ray showed patchy peripheral infiltration.   Acute respiratory failure with hypoxiadue to pneumonia due to COVID-19 virus: - completed course of remdesevir and steroids with good response. - requires 2 liters O2 at D/C -vitamin C, zinc.   HTN:  -Controlled on home meds  High cholesterol -zocor  Diabetes mellitus without complication (HCC): - A1c 9.5 -resume home regimen, adjustment per outpt  PCP  Hyponatremia:Most likely due to GI loss. Poor oral intake,nausea, vomiting and diarrhea.  -Low TSH. low Free T3 and normal T4 - likely euthyroid sick -improved with hydration  Nausea vomiting and diarrhea:Most likely due to COVID-19 infection, resolved with supportive care   DISCHARGE CONDITIONS:  stable CONSULTS OBTAINED:   DRUG ALLERGIES:   Allergies  Allergen Reactions  . Ciprofloxacin Rash   DISCHARGE MEDICATIONS:   Allergies as of 12/03/2019      Reactions   Ciprofloxacin Rash      Medication List    TAKE these medications   albuterol 108 (90 Base) MCG/ACT inhaler Commonly known as: VENTOLIN HFA Inhale 2 puffs into the lungs every 4 (four) hours as needed for wheezing or shortness of breath.   budesonide 3 MG 24 hr capsule Commonly known as: ENTOCORT EC Take 6 mg by mouth daily.   DULoxetine 60 MG capsule Commonly known as: CYMBALTA Take 60 mg by mouth 2 (two) times daily. 1 Capsule(s) By Mouth Twice Daily   furosemide 20 MG tablet Commonly known as: LASIX Take 20 mg by mouth every other day.   gabapentin 300 MG capsule Commonly known as: NEURONTIN Take 900 mg by mouth 2 (two) times daily.   insulin aspart 100 UNIT/ML injection Commonly known as: novoLOG Inject 5 Units into the skin 3 (three) times daily before meals. Sliding scale   lisinopril 40 MG tablet Commonly known as: ZESTRIL Take 40 mg by mouth daily.   MAGNESIUM CITRATE PO Take by mouth See admin instructions. Take 2000 mg in the morning and 1000 mg in the evening   meloxicam 7.5 MG tablet Commonly known as: MOBIC Take 7.5 mg by mouth 2 (two) times  daily as needed.   morphine 15 MG tablet Commonly known as: MSIR Take 15 mg by mouth every 4 (four) hours as needed.   NovoLIN N 100 UNIT/ML injection Generic drug: insulin NPH Human Inject 30 Units into the skin in the morning and at bedtime.   Omega-3 1000 MG Caps Take 1,000 mg by mouth daily.   oxyCODONE 5 MG immediate  release tablet Commonly known as: Oxy IR/ROXICODONE Take 5 mg by mouth every 6 (six) hours.   Ozempic (0.25 or 0.5 MG/DOSE) 2 MG/1.5ML Sopn Generic drug: Semaglutide(0.25 or 0.5MG /DOS) every 7 (seven) days.   pantoprazole 40 MG tablet Commonly known as: PROTONIX Take 40 mg by mouth 2 (two) times daily.   predniSONE 2.5 MG tablet Commonly known as: DELTASONE Take 2.5 mg by mouth daily.   simvastatin 40 MG tablet Commonly known as: ZOCOR Take 40 mg by mouth daily.   tiZANidine 4 MG tablet Commonly known as: ZANAFLEX Take 4 mg by mouth 3 (three) times daily.   vitamin C 250 MG tablet Commonly known as: ASCORBIC ACID Take 250 mg by mouth daily.   zinc sulfate 220 (50 Zn) MG capsule Take 1 capsule (220 mg total) by mouth daily.   zonisamide 25 MG capsule Commonly known as: ZONEGRAN Take 100 mg by mouth at bedtime.            Durable Medical Equipment  (From admission, onward)         Start     Ordered   12/03/19 0957  For home use only DME oxygen  Once    Question Answer Comment  Length of Need 6 Months   Mode or (Route) Nasal cannula   Liters per Minute 2   Frequency Continuous (stationary and portable oxygen unit needed)   Oxygen conserving device Yes   Oxygen delivery system Gas      12/03/19 0957         DISCHARGE INSTRUCTIONS:   DIET:  Regular diet DISCHARGE CONDITION:  Good ACTIVITY:  Activity as tolerated OXYGEN:  Home Oxygen: Yes.    Oxygen Delivery: 2 liters/min via Patient connected to nasal cannula oxygen DISCHARGE LOCATION:  home with HHPT  If you experience worsening of your admission symptoms, develop shortness of breath, life threatening emergency, suicidal or homicidal thoughts you must seek medical attention immediately by calling 911 or calling your MD immediately  if symptoms less severe.  You Must read complete instructions/literature along with all the possible adverse reactions/side effects for all the Medicines you take and  that have been prescribed to you. Take any new Medicines after you have completely understood and accpet all the possible adverse reactions/side effects.   Please note  You were cared for by a hospitalist during your hospital stay. If you have any questions about your discharge medications or the care you received while you were in the hospital after you are discharged, you can call the unit and asked to speak with the hospitalist on call if the hospitalist that took care of you is not available. Once you are discharged, your primary care physician will handle any further medical issues. Please note that NO REFILLS for any discharge medications will be authorized once you are discharged, as it is imperative that you return to your primary care physician (or establish a relationship with a primary care physician if you do not have one) for your aftercare needs so that they can reassess your need for medications and monitor your lab values.  On the day of Discharge:  VITAL SIGNS:  Blood pressure (!) 146/90, pulse 93, temperature 98.1 F (36.7 C), temperature source Oral, resp. rate 12, height 5\' 3"  (1.6 m), weight 104.3 kg, SpO2 90 %. PHYSICAL EXAMINATION:  GENERAL:  54 y.o.-year-old patient lying in the bed with no acute distress.  EYES: Pupils equal, round, reactive to light and accommodation. No scleral icterus. Extraocular muscles intact.  HEENT: Head atraumatic, normocephalic. Oropharynx and nasopharynx clear.  NECK:  Supple, no jugular venous distention. No thyroid enlargement, no tenderness.  LUNGS: Normal breath sounds bilaterally, no wheezing, rales,rhonchi or crepitation. No use of accessory muscles of respiration.  CARDIOVASCULAR: S1, S2 normal. No murmurs, rubs, or gallops.  ABDOMEN: Soft, non-tender, non-distended. Bowel sounds present. No organomegaly or mass.  EXTREMITIES: No pedal edema, cyanosis, or clubbing.  NEUROLOGIC: Cranial nerves II through XII are intact. Muscle strength  5/5 in all extremities. Sensation intact. Gait not checked.  PSYCHIATRIC: The patient is alert and oriented x 3.  SKIN: No obvious rash, lesion, or ulcer.  DATA REVIEW:   CBC Recent Labs  Lab 12/03/19 0554  WBC 5.9  HGB 14.0  HCT 41.4  PLT 243    Chemistries  Recent Labs  Lab 12/02/19 0617 12/02/19 0617 12/03/19 0554  NA 140   < > 137  K 3.1*   < > 3.3*  CL 102   < > 95*  CO2 28   < > 31  GLUCOSE 94   < > 305*  BUN 15   < > 14  CREATININE 0.62   < > 0.72  CALCIUM 8.7*   < > 8.9  MG 1.9  --   --   AST 30   < > 21  ALT 18   < > 23  ALKPHOS 51   < > 63  BILITOT 0.5   < > 0.8   < > = values in this interval not displayed.     Outpatient follow-up Follow-up Information    Thomes Lolling, MD. Schedule an appointment as soon as possible for a visit in 5 day(s).   Specialty: Internal Medicine Contact information: [place] [address] [phone]            Management plans discussed with the patient, family and they are in agreement.  CODE STATUS: Full Code   TOTAL TIME TAKING CARE OF THIS PATIENT: 45 minutes.    Max Sane M.D on 12/03/2019 at 8:58 PM  [facility]   CC: Primary care physician; Thomes Lolling, MD   Note: This dictation was prepared with Dragon dictation along with smaller phrase technology. Any transcriptional errors that result from this process are unintentional.

## 2019-12-03 NOTE — Progress Notes (Signed)
SATURATION QUALIFICATIONS: (This note is used to comply with regulatory documentation for home oxygen)  Patient Saturations on Room Air at Rest = 90%  Patient Saturations on Room Air while Ambulating = 87%  Patient Saturations on 2 Liters of oxygen while Ambulating = 97%  Please briefly explain why patient needs home oxygen:  Pt oxygen saturation dropped to 87% while on room air, requiring pt to need 2L of oxygen via nasal cannula.

## 2019-12-03 NOTE — Progress Notes (Signed)
Inpatient Diabetes Program Recommendations  AACE/ADA: New Consensus Statement on Inpatient Glycemic Control  Target Ranges:  Prepandial:   less than 140 mg/dL      Peak postprandial:   less than 180 mg/dL (1-2 hours)      Critically ill patients:  140 - 180 mg/dL  Results for Brittany Morris, Brittany Morris (MRN [id number]) as of 12/03/2019 07:53  Ref. Range 12/03/2019 05:54  Glucose Latest Ref Range: 70 - 99 mg/dL 305 (H)   Results for Brittany Morris, Brittany Morris (MRN [id number]) as of 12/03/2019 07:53  Ref. Range 12/02/2019 08:00 12/02/2019 12:03 12/02/2019 15:35 12/02/2019 20:40  Glucose-Capillary Latest Ref Range: 70 - 99 mg/dL 67 (L) 262 (H)  Novolog 11 units  Levemir 25 units 283 (H)  Novolog 11 units 437 (H)  Novolog 5 units  Levemir 15 units   Review of Glycemic Control  Diabetes history:DM2 Outpatient Diabetes medications:Ozempic 0.25 mg Qweek, NPH 20 units BID, Novolog 7 units TID with meals plus additional units for correction (per [facility] Endocrinology note on 09/15/19) Current orders for Inpatient glycemic control:Levemir 15 units BID,Novolog 0-20units TID with meals, Novolog 0-5 units QHS, Novolog 10 units TID with meals; Solumedrol 40 mg daily  Inpatient Diabetes Program Recommendations:   Insulin - Basal: Please consider increasing Levemir 25 units BID.  Insulin - Meal Coverage: Noted meal coverage was NOT GIVEN at all on 12/02/19. As a result, post prandial glucose consistently elevated and up to 437 mg/dl by bedtime.  RNs, please administer meal coverage insulin when parameters are met.    Thanks, Barnie Alderman, RN, MSN, CDE Diabetes Coordinator Inpatient Diabetes Program [phone] (Team Pager from 8am to 5pm)

## 2019-12-03 NOTE — TOC Progression Note (Addendum)
Transition of Care [facility]) - Progression Note    Patient Details  Name: Brittany Morris MRN: [id number] Date of Birth: 13-Jan-1966  [facility] of [facility]) CM/SW Contact  Tattiana Fakhouri, Lemar Livings, LCSW Phone Number: 12/03/2019, 9:22 AM  Clinical Narrative:  Checking with MD and RN regarding home O2. Bedside RN to check to see. Will await results. Looking for home health agency to accept [employer]-Medicare. For HHPT  10:00 am Pt does qualify for home O2 bedside RN has placed in chart. Referral made to [facility] for home O2-Brad aware and will bring to her room prior to DC home. [facility] has accepted referral for HHPT.        Expected Discharge Plan and Services           Expected Discharge Date: 12/03/19                                     Social Determinants of Health (SDOH) Interventions    Readmission Risk Interventions No flowsheet data found.

## 2019-12-03 NOTE — Progress Notes (Signed)
Pt with oxygen sats dropping in mid to high 80's on room air during sleep. O2 placed at 2L/[place]. Sats of 94% with oxygen. Continue to monitor.

## 2019-12-04 LAB — CULTURE, BLOOD (ROUTINE X 2)
Culture: NO GROWTH
Culture: NO GROWTH
Special Requests: ADEQUATE

## 2019-12-15 ENCOUNTER — Other Ambulatory Visit: Payer: Self-pay | Admitting: Internal Medicine

## 2019-12-15 DIAGNOSIS — U071 COVID-19: Secondary | ICD-10-CM

## 2019-12-29 ENCOUNTER — Other Ambulatory Visit: Payer: Self-pay | Admitting: Internal Medicine

## 2020-06-06 DIAGNOSIS — G43009 Migraine without aura, not intractable, without status migrainosus: Secondary | ICD-10-CM | POA: Insufficient documentation

## 2020-06-06 DIAGNOSIS — G5791 Unspecified mononeuropathy of right lower limb: Secondary | ICD-10-CM | POA: Insufficient documentation

## 2020-06-06 DIAGNOSIS — M7918 Myalgia, other site: Secondary | ICD-10-CM | POA: Insufficient documentation

## 2021-03-06 ENCOUNTER — Ambulatory Visit: Payer: Self-pay

## 2021-03-06 ENCOUNTER — Encounter: Payer: Self-pay | Admitting: Orthopaedic Surgery

## 2021-03-06 ENCOUNTER — Ambulatory Visit (INDEPENDENT_AMBULATORY_CARE_PROVIDER_SITE_OTHER): Payer: Medicare Other | Admitting: Orthopaedic Surgery

## 2021-03-06 VITALS — Ht 65.75 in | Wt 237.0 lb

## 2021-03-06 DIAGNOSIS — M25561 Pain in right knee: Secondary | ICD-10-CM

## 2021-03-06 DIAGNOSIS — G8929 Other chronic pain: Secondary | ICD-10-CM

## 2021-03-06 DIAGNOSIS — M1711 Unilateral primary osteoarthritis, right knee: Secondary | ICD-10-CM | POA: Diagnosis not present

## 2021-03-06 NOTE — Progress Notes (Signed)
Office Visit Note   Patient: Brittany Morris           Date of Birth: Oct 19, 1965           MRN: [id number] Visit Date: 03/06/2021              Requested by: Madaline Guthrie, MD [address] [place],  [place] [address] PCP: Madaline Guthrie, MD   Assessment & Plan: Visit Diagnoses:  1. Chronic pain of right knee   2. Unilateral primary osteoarthritis, right knee     Plan: She is now tried and failed every form of conservative treatment for her right knee.  Given the severity of her pain combined with the severity of arthritis on clinical exam and x-rays, I have recommended knee replacement as well.  I showed her knee replacement model and we had a long and thorough discussion about the risks and benefits of the surgery and what to expect from an intraoperative and postoperative course.  She is interested in having surgery in October and we will see what we can do to get this scheduled.  All questions and concerns were answered and addressed.  Follow-Up Instructions: Return for 2 weeks post-op.   Orders:  Orders Placed This Encounter  Procedures   XR KNEE 3 VIEW RIGHT   No orders of the defined types were placed in this encounter.     Procedures: No procedures performed   Clinical Data: No additional findings.   Subjective: Chief Complaint  Patient presents with   Right Knee - Pain  The patient comes in with chronic right knee pain for greater than a year now.  She has had conservative treatment for the same amount of time and has been told by her orthopedic doctor in [facility] that she needs a knee replacement at this point given the failed conservative treatment.  She was actually referred to me by a friend who is a home health physical therapist.  She has had multiple steroid injections in that right knee.  She has had hyaluronic acid injections.  She is had ablations of the nerve around her right knee.  She has been on anti-inflammatories and had no relief.  She is in  chronic pain management for severe pain as it relates to a remote hysterectomy that involves some type of nerve damage she states.  She is a diabetic but her last hemoglobin A1c a month ago was 6.  At this point her right knee pain is 10 out of 10 and is daily.  She has significant stiffness with that knee that wakes her up at night.  At this point she does wish to proceed with knee replacement surgery due to the detrimental effect her right knee has having on her mobility, her quality of life and actives daily living.  She is even tried to walk with a cane to offload that knee.  HPI  Review of Systems There is currently listed no headache, chest pain, shortness of breath, fever, chills, nausea, vomiting  Objective: Vital Signs: Ht 5' 5.75" (1.67 m)   Wt 237 lb (107.5 kg)   BMI 38.54 kg/m   Physical Exam She is alert and orient x3 and in no acute distress Ortho Exam Examination of her right knee does show mild effusion.  There is definitely varus malalignment.  There is patellofemoral crepitation throughout the arc of motion of the knee and significant medial joint space pain to palpation.  The knee is ligamentously stable. Specialty  Comments:  No specialty comments available.  Imaging: XR KNEE 3 VIEW RIGHT  Result Date: 03/06/2021 X-rays of the right knee shows significant tricompartment arthritis with varus malalignment, significant medial and patellofemoral joint space narrowing and osteophytes in all 3 compartments.    PMFS History: Patient Active Problem List   Diagnosis Date Noted   Unilateral primary osteoarthritis, right knee 03/06/2021   Acute respiratory failure with hypoxia (HCC) 11/29/2019   Hypertension    High cholesterol    Diabetes mellitus without complication (HCC)    COVID-19 virus infection    Hyponatremia    Nausea vomiting and diarrhea    Past Medical History:  Diagnosis Date   Diabetes mellitus without complication (HCC)    High cholesterol     Hypertension    Nerve pain     Family History  Problem Relation Age of Onset   Heart disease Mother     Past Surgical History:  Procedure Laterality Date   ABDOMINAL HYSTERECTOMY     Social History   Occupational History   Not on file  Tobacco Use   Smoking status: Former   Smokeless tobacco: Never  Substance and Sexual Activity   Alcohol use: Not Currently   Drug use: Never   Sexual activity: Not Currently

## 2021-03-31 ENCOUNTER — Other Ambulatory Visit: Payer: Self-pay

## 2021-04-25 NOTE — Progress Notes (Addendum)
COVID swab appointment: 05/03/21  COVID Vaccine Completed: yes x2 Date COVID Vaccine completed: 02/23/20, 03/31/20 Has received booster: COVID vaccine manufacturer:   Moderna     Date of COVID positive in last 90 days: No  PCP - Dr Betti Cruz Cardiologist - N/a  Chest x-ray - CT 04/15/20 Care Everywhere EKG - 04/26/21 Epic Stress Test - 06/09/20 Care Everywhere ECHO - N/a Cardiac Cath - years ago per pt Pacemaker/ICD device last checked: N/a Spinal Cord Stimulator: yes abd to spine for morphine. Battery controlled, no remote, Morphine replaced every month  Sleep Study - yes needs another one done.  CPAP -   Fasting Blood Sugar - 80-200 Checks Blood Sugar _'constantly' per pt_ times a day Libre CGM  Blood Thinner Instructions: N/a Aspirin Instructions: Last Dose:  Activity level: Can go up a flight of stairs and perform activities of daily living without stopping and without symptoms of chest pain or shortness of breath.        Anesthesia review: difficult to wake up and breath post anesthesia, HTN, DM, STOP BANG 5  Patient denies shortness of breath, fever, cough and chest pain at PAT appointment   Patient verbalized understanding of instructions that were given to them at the PAT appointment. Patient was also instructed that they will need to review over the PAT instructions again at home before surgery.

## 2021-04-25 NOTE — Patient Instructions (Addendum)
DUE TO COVID-19 ONLY ONE VISITOR IS ALLOWED TO COME WITH YOU AND STAY IN THE WAITING ROOM ONLY DURING PRE OP AND PROCEDURE.   **NO VISITORS ARE ALLOWED IN THE SHORT STAY AREA OR RECOVERY ROOM!!**  IF YOU WILL BE ADMITTED INTO THE HOSPITAL YOU ARE ALLOWED ONLY TWO SUPPORT PEOPLE DURING VISITATION HOURS ONLY (10AM -8PM)   The support person(s) may change daily. The support person(s) must pass our screening, gel in and out, and wear a mask at all times, including in the patient's room. Patients must also wear a mask when staff or their support person are in the room.  No visitors under the age of 9. Any visitor under the age of 48 must be accompanied by an adult.    COVID SWAB TESTING MUST BE COMPLETED ON:  05/03/21 **MUST PRESENT COMPLETED FORM AT TESTING SITE**    [address] [place] (backside of the building) Open 8am-3pm. No appointment needed. You are not required to quarantine, however you are required to wear a well-fitted mask when you are out and around people not in your household.  Hand Hygiene often Do NOT share personal items Notify your provider if you are in close contact with someone who has COVID or you develop fever 100.4 or greater, new onset of sneezing, cough, sore throat, shortness of breath or body aches.       Your procedure is scheduled on: 05/05/21   Report to [facility] Main  Entrance   Report to Short Stay at 5:15 AM   [facility])   Call this number if you have problems the morning of surgery [phone]   Do not eat food :After Midnight.   May have liquids until  4:15 AM  day of surgery  CLEAR LIQUID DIET  Foods Allowed                                                                     Foods Excluded  Water, Black Coffee and tea (no milk or creamer)           liquids that you cannot  Plain Jell-O in any flavor  (No red)                                    see through such as: Fruit ices (not with fruit pulp)                                             milk, soups, orange juice              Iced Popsicles (No red)                                                All solid food  Apple juices Sports drinks like Gatorade (No red) Lightly seasoned clear broth or consume(fat free) Sugar, honey syrup   Oral Hygiene is also important to reduce your risk of infection.                                    Remember - BRUSH YOUR TEETH THE MORNING OF SURGERY WITH YOUR REGULAR TOOTHPASTE  Take these medicines the morning of surgery with A SIP OF WATER: Tylenol, Amlodipine, Budesonide, Depakote, Gabapentin, Oxycodone, Pantoprazole, Prednisone.   DO NOT TAKE ANY ORAL DIABETIC MEDICATIONS DAY OF YOUR SURGERY  How to Manage Your Diabetes Before and After Surgery  Why is it important to control my blood sugar before and after surgery? Improving blood sugar levels before and after surgery helps healing and can limit problems. A way of improving blood sugar control is eating a healthy diet by:  Eating less sugar and carbohydrates  Increasing activity/exercise  Talking with your doctor about reaching your blood sugar goals High blood sugars (greater than 180 mg/dL) can raise your risk of infections and slow your recovery, so you will need to focus on controlling your diabetes during the weeks before surgery. Make sure that the doctor who takes care of your diabetes knows about your planned surgery including the date and location.  How do I manage my blood sugar before surgery? Check your blood sugar at least 4 times a day, starting 2 days before surgery, to make sure that the level is not too high or low. Check your blood sugar the morning of your surgery when you wake up and every 2 hours until you get to the Short Stay unit. If your blood sugar is less than 70 mg/dL, you will need to treat for low blood sugar: Do not take insulin. Treat a low blood sugar (less than 70 mg/dL) with  cup of clear  juice (cranberry or apple), 4 glucose tablets, OR glucose gel. Recheck blood sugar in 15 minutes after treatment (to make sure it is greater than 70 mg/dL). If your blood sugar is not greater than 70 mg/dL on recheck, call [phone] for further instructions. Report your blood sugar to the short stay nurse when you get to Short Stay.  If you are admitted to the hospital after surgery: Your blood sugar will be checked by the staff and you will probably be given insulin after surgery (instead of oral diabetes medicines) to make sure you have good blood sugar levels. The goal for blood sugar control after surgery is 80-180 mg/dL.   WHAT DO I DO ABOUT MY DIABETES MEDICATION?  Do not take oral diabetes medicines (pills) the morning of surgery.  THE DAY BEFORE SURGERY, take  Humalog as normal before meals. Take normal dose of Novolin in the morning, take 50% of evening dose. Do not take Jardiance.       THE MORNING OF SURGERY, Do not take Humalog unless blood sugar is greater than 220, then take 50% of sliding scale dose. Take 50% of Novolin dose. Do not take Jardiance.  The day of surgery, do not take other diabetes injectables, including Byetta (exenatide), Bydureon (exenatide ER), Victoza (liraglutide), or Trulicity (dulaglutide).  If your CBG is greater than 220 mg/dL, you may take  of your sliding scale  (correction) dose of insulin.   Reviewed and Endorsed by [facility] Patient Education Committee, August 2015  You may not have any metal on your body including hair pins, jewelry, and body piercing             Do not wear make-up, lotions, powders, perfumes, or deodorant  Do not wear nail polish including gel and S&S, artificial/acrylic nails, or any other type of covering on natural nails including finger and toenails. If you have artificial nails, gel coating, etc. that needs to be removed by a nail salon please have this removed prior to surgery or  surgery may need to be canceled/ delayed if the surgeon/ anesthesia feels like they are unable to be safely monitored.   Do not shave  48 hours prior to surgery.    Do not bring valuables to the hospital. [facility] IS NOT             RESPONSIBLE   FOR VALUABLES.   Contacts, dentures or bridgework may not be worn into surgery.   Bring small overnight bag day of surgery.   Special Instructions: Bring a copy of your healthcare power of attorney and living will documents         the day of surgery if you haven't scanned them in before.  Please read over the following fact sheets you were given: IF YOU HAVE QUESTIONS ABOUT YOUR PRE OP INSTRUCTIONS PLEASE CALL [phone]- [facility] - Preparing for Surgery Before surgery, you can play an important role.  Because skin is not sterile, your skin needs to be as free of germs as possible.  You can reduce the number of germs on your skin by washing with CHG (chlorahexidine gluconate) soap before surgery.  CHG is an antiseptic cleaner which kills germs and bonds with the skin to continue killing germs even after washing. Please DO NOT use if you have an allergy to CHG or antibacterial soaps.  If your skin becomes reddened/irritated stop using the CHG and inform your nurse when you arrive at Short Stay. Do not shave (including legs and underarms) for at least 48 hours prior to the first CHG shower.  You may shave your face/neck.  Please follow these instructions carefully:  1.  Shower with CHG Soap the night before surgery and the  morning of surgery.  2.  If you choose to wash your hair, wash your hair first as usual with your normal  shampoo.  3.  After you shampoo, rinse your hair and body thoroughly to remove the shampoo.                             4.  Use CHG as you would any other liquid soap.  You can apply chg directly to the skin and wash.  Gently with a scrungie or clean washcloth.  5.  Apply the CHG Soap to your body ONLY FROM  THE NECK DOWN.   Do   not use on face/ open                           Wound or open sores. Avoid contact with eyes, ears mouth and   genitals (private parts).                       Wash face,  Genitals (private parts) with your normal soap.             6.  Wash thoroughly, paying special attention  to the area where your    surgery  will be performed.  7.  Thoroughly rinse your body with warm water from the neck down.  8.  DO NOT shower/wash with your normal soap after using and rinsing off the CHG Soap.                9.  Pat yourself dry with a clean towel.            10.  Wear clean pajamas.            11.  Place clean sheets on your bed the night of your first shower and do not  sleep with pets. Day of Surgery : Do not apply any lotions/deodorants the morning of surgery.  Please wear clean clothes to the hospital/surgery center.  FAILURE TO FOLLOW THESE INSTRUCTIONS MAY RESULT IN THE CANCELLATION OF YOUR SURGERY  PATIENT SIGNATURE_________________________________  NURSE SIGNATURE__________________________________  ________________________________________________________________________

## 2021-04-25 NOTE — Progress Notes (Signed)
Please place orders for PAT appointment scheduled 04/26/21. 

## 2021-04-26 ENCOUNTER — Telehealth: Payer: Self-pay | Admitting: Orthopaedic Surgery

## 2021-04-26 ENCOUNTER — Encounter (HOSPITAL_COMMUNITY)
Admission: RE | Admit: 2021-04-26 | Discharge: 2021-04-26 | Disposition: A | Discharge status: Home / Self Care | Payer: Medicare Other | Source: Ambulatory Visit | Attending: Orthopaedic Surgery | Admitting: Orthopaedic Surgery

## 2021-04-26 ENCOUNTER — Encounter (HOSPITAL_COMMUNITY): Payer: Self-pay

## 2021-04-26 ENCOUNTER — Other Ambulatory Visit: Payer: Self-pay

## 2021-04-26 DIAGNOSIS — M1711 Unilateral primary osteoarthritis, right knee: Secondary | ICD-10-CM | POA: Diagnosis not present

## 2021-04-26 DIAGNOSIS — Z01818 Encounter for other preprocedural examination: Secondary | ICD-10-CM | POA: Insufficient documentation

## 2021-04-26 DIAGNOSIS — Z22322 Carrier or suspected carrier of Methicillin resistant Staphylococcus aureus: Secondary | ICD-10-CM

## 2021-04-26 HISTORY — DX: Pneumonia, unspecified organism: J18.9

## 2021-04-26 HISTORY — DX: Headache, unspecified: R51.9

## 2021-04-26 HISTORY — DX: Unspecified osteoarthritis, unspecified site: M19.90

## 2021-04-26 HISTORY — DX: Sarcoidosis, unspecified: D86.9

## 2021-04-26 HISTORY — DX: Other complications of anesthesia, initial encounter: T88.59XA

## 2021-04-26 HISTORY — DX: Carrier or suspected carrier of methicillin resistant Staphylococcus aureus: Z22.322

## 2021-04-26 LAB — CBC
HCT: 45.4 % (ref 36.0–46.0)
Hemoglobin: 13.9 g/dL (ref 12.0–15.0)
MCH: 29.4 pg (ref 26.0–34.0)
MCHC: 30.6 g/dL (ref 30.0–36.0)
MCV: 96.2 fL (ref 80.0–100.0)
Platelets: 157 10*3/uL (ref 150–400)
RBC: 4.72 MIL/uL (ref 3.87–5.11)
RDW: 13.9 % (ref 11.5–15.5)
WBC: 6.5 10*3/uL (ref 4.0–10.5)
nRBC: 0 % (ref 0.0–0.2)

## 2021-04-26 LAB — SURGICAL PCR SCREEN
MRSA, PCR: POSITIVE — AB
Staphylococcus aureus: POSITIVE — AB

## 2021-04-26 LAB — BASIC METABOLIC PANEL
Anion gap: 6 (ref 5–15)
BUN: 11 mg/dL (ref 6–20)
CO2: 29 mmol/L (ref 22–32)
Calcium: 8.9 mg/dL (ref 8.9–10.3)
Chloride: 103 mmol/L (ref 98–111)
Creatinine, Ser: 0.73 mg/dL (ref 0.44–1.00)
GFR, Estimated: >60 mL/min (ref >60)
Glucose, Bld: 156 mg/dL — ABNORMAL HIGH (ref 70–99)
Potassium: 3.8 mmol/L (ref 3.5–5.1)
Sodium: 138 mmol/L (ref 135–145)

## 2021-04-26 LAB — HEMOGLOBIN A1C
Hgb A1c MFr Bld: 6.4 % — ABNORMAL HIGH (ref 4.8–5.6)
Mean Plasma Glucose: 136.98 mg/dL

## 2021-04-26 LAB — GLUCOSE, CAPILLARY: Glucose-Capillary: 140 mg/dL — ABNORMAL HIGH (ref 70–99)

## 2021-04-26 NOTE — Telephone Encounter (Signed)
Questions answered.

## 2021-04-26 NOTE — Progress Notes (Signed)
   04/26/21 1129  OBSTRUCTIVE SLEEP APNEA  Have you ever been diagnosed with sleep apnea through a sleep study? No  Do you snore loudly (loud enough to be heard through closed doors)?  1  Do you often feel tired, fatigued, or sleepy during the daytime (such as falling asleep during driving or talking to someone)? 0  Has anyone observed you stop breathing during your sleep? 0  Do you have, or are you being treated for high blood pressure? 1  BMI more than 35 kg/m2? 1  Age > 50 (1-yes) 1  Neck circumference greater than:Female 16 inches or larger, Female 17inches or larger? 1  Female Gender (Yes=1) 0  Obstructive Sleep Apnea Score 5  Score 5 or greater  Results sent to PCP

## 2021-04-26 NOTE — Telephone Encounter (Signed)
Pt called requesting a call back. Pt has some medical questions about her surgery on Friday. Please call pt at [phone].

## 2021-04-26 NOTE — Progress Notes (Signed)
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 Results sent to Dr. Magnus Ivan.

## 2021-04-28 ENCOUNTER — Encounter (HOSPITAL_COMMUNITY): Payer: Self-pay

## 2021-04-28 ENCOUNTER — Encounter (HOSPITAL_COMMUNITY): Payer: Self-pay | Admitting: Physician Assistant

## 2021-04-28 NOTE — Progress Notes (Signed)
Anesthesia Chart Review   Case: [id number] Date/Time: 05/05/21 0700   Procedure: RIGHT TOTAL KNEE ARTHROPLASTY (Right: Knee)   Anesthesia type: Choice   Pre-op diagnosis: osteoarthritis right knee   Location: [facility] ROOM 09 / [facility]   Surgeons: Kathryne Hitch, MD       DISCUSSION:55 y.o. former smoker with h/o DM II, HTN, chronic pain, OSA, right knee OA scheduled for above procedure 05/05/2021 with Dr. Doneen Poisson.   Pt with intrathecal morphine pump in place, 10.5 mg/day.  She additionally has Oxycodone 5 mg q6h prn prescribed.   Per previous anesthesia note 10/07/2019, "Appropriate for postop monitored bed in step-down unit. She has a Hx of OSA and non-compliance with regular CPAP. Based on observation in PACU, consider this patient may have central sleep apnea. Recommend avoiding IV opioids when possible."  Anticipate pt can proceed with planned procedure barring acute status change and after evaluation DOS by anesthesia.   VS: BP (!) 141/83   Pulse 84   Temp 37.1 C (Oral)   Resp 18   Ht 5\' 6"  (1.676 m)   Wt 109.5 kg   SpO2 96%   BMI 38.96 kg/m   PROVIDERS: [facility], [facility]   LABS: Labs reviewed: Acceptable for surgery. (all labs ordered are listed, but only abnormal results are displayed)  Labs Reviewed  SURGICAL PCR SCREEN - Abnormal; Notable for the following components:      Result Value   MRSA, PCR POSITIVE (*)    Staphylococcus aureus POSITIVE (*)    All other components within normal limits  HEMOGLOBIN A1C - Abnormal; Notable for the following components:   Hgb A1c MFr Bld 6.4 (*)    All other components within normal limits  BASIC METABOLIC PANEL - Abnormal; Notable for the following components:   Glucose, Bld 156 (*)    All other components within normal limits  GLUCOSE, CAPILLARY - Abnormal; Notable for the following components:   Glucose-Capillary 140 (*)    All other components within normal limits  CBC      IMAGES: Lumbar MRI 07/20/2020  1. L2-L3: Moderate to severe canal stenosis with mild bilateral neural foraminal narrowing. Findings are not significantly changed.  2. L3-L4: Moderate to severe canal stenosis and moderate bilateral neural foraminal narrowing. Findings are not significantly changed.  3. L4-L5: Posterior soft tissues demonstrate mild edema and enhancement, likely postsurgical in nature. Significantly improved canal stenosis at this level, now mild with some residual disc material within the left foraminal zone with moderate bilateral neural foraminal narrowing at this level.   EKG: 04/26/2021 Rate 87 bpm  NSR Minimal voltage criteria for LVH, may be normal variant Possible anterior infarct, age undetermined  CV: Stress Test 06/15/2020 IMPRESSIONS:  __________________________________________________________________  - Low risk probably normal myocardial perfusion study  - There is a small in size, moderate fixed perfusion abnormality involving the apical and apical anterior segments. This is consistent with probable artifact given normal wall motion in this region.  - Left ventricular systolic function is normal. Post stress the ejection fraction is > 65%. The left ventricle is normal in size.  - No significant coronary calcifications were noted on the attenuation CT  - Incidental CT findings: Incidental note is made of a patulous esophagus. Past Medical History:  Diagnosis Date   Arthritis    Complication of anesthesia    difficulty with breathing after and waking up   Diabetes mellitus without complication (HCC)    Headache    migraines  High cholesterol    Hypertension    Nerve pain    Pneumonia    Sarcoidosis     Past Surgical History:  Procedure Laterality Date   ABDOMINAL HYSTERECTOMY     APPENDECTOMY     BACK SURGERY     CESAREAN SECTION     x3   CHOLECYSTECTOMY     NERVE EXPLORATION      MEDICATIONS:  acetaminophen (TYLENOL) 500 MG tablet    amLODipine (NORVASC) 5 MG tablet   Ascorbic Acid (VITAMIN C) 1000 MG tablet   budesonide (ENTOCORT EC) 3 MG 24 hr capsule   Calcium Carbonate (CALCIUM 500 PO)   Cholecalciferol (VITAMIN D) 50 MCG (2000 UT) tablet   divalproex (DEPAKOTE) 500 MG DR tablet   Dulaglutide (TRULICITY) 0.75 MG/0.5ML SOPN   DULoxetine (CYMBALTA) 60 MG capsule   empagliflozin (JARDIANCE) 10 MG TABS tablet   ferrous sulfate 325 (65 FE) MG tablet   furosemide (LASIX) 20 MG tablet   gabapentin (NEURONTIN) 300 MG capsule   insulin lispro (HUMALOG) 100 UNIT/ML injection   insulin NPH Human (NOVOLIN N) 100 UNIT/ML injection   lisinopril (ZESTRIL) 40 MG tablet   Magnesium 400 MG TABS   methocarbamol (ROBAXIN) 500 MG tablet   MORPHINE SULFATE IV   nystatin cream (MYCOSTATIN)   Omega-3 1000 MG CAPS   oxyCODONE (OXY IR/ROXICODONE) 5 MG immediate release tablet   pantoprazole (PROTONIX) 40 MG tablet   predniSONE (DELTASONE) 2.5 MG tablet   promethazine (PHENERGAN) 12.5 MG tablet   PROMETHEGAN 25 MG suppository   rizatriptan (MAXALT) 10 MG tablet   simvastatin (ZOCOR) 40 MG tablet   tiZANidine (ZANAFLEX) 4 MG tablet   No current facility-administered medications for this encounter.   Jodell Cipro Ward, PA-C [facility] Pre-Surgical Testing [phone]

## 2021-04-28 NOTE — Anesthesia Preprocedure Evaluation (Deleted)
Anesthesia Evaluation    Airway        Dental   Pulmonary former smoker,           Cardiovascular hypertension,      Neuro/Psych    GI/Hepatic   Endo/Other  diabetes  Renal/GU      Musculoskeletal   Abdominal   Peds  Hematology   Anesthesia Other Findings   Reproductive/Obstetrics                             Anesthesia Physical Anesthesia Plan  ASA:   Anesthesia Plan:    Post-op Pain Management:    Induction:   PONV Risk Score and Plan:   Airway Management Planned:   Additional Equipment:   Intra-op Plan:   Post-operative Plan:   Informed Consent:   Plan Discussed with:   Anesthesia Plan Comments: (See PAT note 04/26/2021, Jodell Cipro Ward, PA-C)        Anesthesia Quick Evaluation

## 2021-05-03 ENCOUNTER — Other Ambulatory Visit: Payer: Self-pay | Admitting: Orthopaedic Surgery

## 2021-05-03 LAB — SARS CORONAVIRUS 2 (TAT 6-24 HRS): SARS Coronavirus 2: POSITIVE — AB

## 2021-05-03 NOTE — Progress Notes (Signed)
Called Kim from the on-call service of Dr. Maureen Ralphs with + covid results for this pt. The pt is to have surgery on Fri 05/05/21 at 0700 at [facility]. The pt has not been notified per [facility].   Positive Results for:  Asymptomatic: Procedure postponed and quarantined for 10 days, unless the procedure is urgent. Please follow the facility's protocol regarding pt and family arrival.  Symptomatic: Procedure postponed and quarantined for 14 days.  Hospitalized with Covid: postponed and quarantined  for 21 days.  Immunocompromised: Procedure postponed and quarantine for 20 days.  The pt will not be retested for 90 days from the + result.

## 2021-05-04 MED ORDER — VANCOMYCIN HCL 1500 MG/300ML IV SOLN
1500.0000 mg | Freq: Once | INTRAVENOUS | Status: DC
  Filled 2021-05-04: qty 300

## 2021-05-05 ENCOUNTER — Encounter: Payer: Self-pay | Admitting: Orthopaedic Surgery

## 2021-05-17 NOTE — Progress Notes (Signed)
Pt. Needs orders for surgery. PAT: 05/18/21. 

## 2021-05-17 NOTE — Patient Instructions (Addendum)
DUE TO COVID-19 ONLY ONE VISITOR IS ALLOWED TO COME WITH YOU AND STAY IN THE WAITING ROOM ONLY DURING PRE OP AND PROCEDURE.   **NO VISITORS ARE ALLOWED IN THE SHORT STAY AREA OR RECOVERY ROOM!!**  IF YOU WILL BE ADMITTED INTO THE HOSPITAL YOU ARE ALLOWED ONLY TWO SUPPORT PEOPLE DURING VISITATION HOURS ONLY (10AM -8PM)   The support person(s) may change daily. The support person(s) must pass our screening, gel in and out, and wear a mask at all times, including in the patient's room. Patients must also wear a mask when staff or their support person are in the room.  No visitors under the age of 71. Any visitor under the age of 61 must be accompanied by an adult.     Your procedure is scheduled on: 05/25/21   Report to [facility] Main Entrance    Report to admitting at : 6:15 AM   Call this number if you have problems the morning of surgery [phone]   Do not eat food :After Midnight.   May have liquids until: 6:00 AM    day of surgery  CLEAR LIQUID DIET  Foods Allowed                                                                     Foods Excluded  Water, Black Coffee and tea, regular and decaf                             liquids that you cannot  Plain Jell-O in any flavor  (No red)                                           see through such as: Fruit ices (not with fruit pulp)                                     milk, soups, orange juice              Iced Popsicles (No red)                                    All solid food                                   Apple juices Sports drinks like Gatorade (No red) Lightly seasoned clear broth or consume(fat free) Sugar,   Sample Menu Breakfast                                Lunch                                     Supper Cranberry juice  Beef broth                            Chicken broth Jell-O                                     Grape juice                           Apple juice Coffee or tea                         Jell-O                                      Popsicle                                                Coffee or tea                        Coffee or tea      Complete one Gatorade drink the morning of surgery at : 6:00 am      the day of surgery.       The day of surgery:  Drink ONE (1) Pre-Surgery Clear Ensure or G2 by am the morning of surgery. Drink in one sitting. Do not sip.  This drink was given to you during your hospital  pre-op appointment visit. Nothing else to drink after completing the  Pre-Surgery Clear Ensure or G2.          If you have questions, please contact your surgeon's office.     Oral Hygiene is also important to reduce your risk of infection.                                    Remember - BRUSH YOUR TEETH THE MORNING OF SURGERY WITH YOUR REGULAR TOOTHPASTE   Do NOT smoke after Midnight   Take these medicines the morning of surgery with A SIP OF WATER: Depakote,gabapentin,duloxetine,amlodipine,budesonide,prednisone,pantoprazole.  How to Manage Your Diabetes Before and After Surgery  Why is it important to control my blood sugar before and after surgery? Improving blood sugar levels before and after surgery helps healing and can limit problems. A way of improving blood sugar control is eating a healthy diet by:  Eating less sugar and carbohydrates  Increasing activity/exercise  Talking with your doctor about reaching your blood sugar goals High blood sugars (greater than 180 mg/dL) can raise your risk of infections and slow your recovery, so you will need to focus on controlling your diabetes during the weeks before surgery. Make sure that the doctor who takes care of your diabetes knows about your planned surgery including the date and location.  How do I manage my blood sugar before surgery? Check your blood sugar at least 4 times a day, starting 2 days before surgery, to make sure that the level is not too high or low. Check your blood sugar  the morning of your surgery when  you wake up and every 2 hours until you get to the Short Stay unit. If your blood sugar is less than 70 mg/dL, you will need to treat for low blood sugar: Do not take insulin. Treat a low blood sugar (less than 70 mg/dL) with  cup of clear juice (cranberry or apple), 4 glucose tablets, OR glucose gel. Recheck blood sugar in 15 minutes after treatment (to make sure it is greater than 70 mg/dL). If your blood sugar is not greater than 70 mg/dL on recheck, call [phone] for further instructions. Report your blood sugar to the short stay nurse when you get to Short Stay.  If you are admitted to the hospital after surgery: Your blood sugar will be checked by the staff and you will probably be given insulin after surgery (instead of oral diabetes medicines) to make sure you have good blood sugar levels. The goal for blood sugar control after surgery is 80-180 mg/dL.   WHAT DO I DO ABOUT MY DIABETES MEDICATION?  Do not take oral diabetes medicines (pills) the morning of surgery.  THE DAY BEFORE SURGERY, take trulicity. Lispro insulin for breakfast and lunch,DO NOT take bed dose . Novolin as usual on AM,ONLY half of the PM dose. DO NOT take Jardiance.     THE MORNING OF SURGERY, take half of the Novolin insulin dose.If your CBG is greater than 220 mg/dL, you may take  of your sliding scale of lispro insulin. (correction) dose of insulin.                                You may not have any metal on your body including hair pins, jewelry, and body piercing             Do not wear make-up, lotions, powders, perfumes/cologne, or deodorant  Do not wear nail polish including gel and S&S, artificial/acrylic nails, or any other type of covering on natural nails including finger and toenails. If you have artificial nails, gel coating, etc. that needs to be removed by a nail salon please have this removed prior to surgery or surgery may need to be canceled/ delayed if  the surgeon/ anesthesia feels like they are unable to be safely monitored.   Do not shave  48 hours prior to surgery.    Do not bring valuables to the hospital. [facility] IS NOT             RESPONSIBLE   FOR VALUABLES.   Contacts, dentures or bridgework may not be worn into surgery.   Bring small overnight bag day of surgery.    Patients discharged on the day of surgery will not be allowed to drive home.   Special Instructions: Bring a copy of your healthcare power of attorney and living will documents         the day of surgery if you haven't scanned them before.              Please read over the following fact sheets you were given: IF YOU HAVE QUESTIONS ABOUT YOUR PRE-OP INSTRUCTIONS PLEASE CALL [phone]   [facility] - Preparing for Surgery Before surgery, you can play an important role.  Because skin is not sterile, your skin needs to be as free of germs as possible.  You can reduce the number of germs on your skin by washing with CHG (chlorahexidine gluconate) soap before surgery.  CHG is an antiseptic  cleaner which kills germs and bonds with the skin to continue killing germs even after washing. Please DO NOT use if you have an allergy to CHG or antibacterial soaps.  If your skin becomes reddened/irritated stop using the CHG and inform your nurse when you arrive at Short Stay. Do not shave (including legs and underarms) for at least 48 hours prior to the first CHG shower.  You may shave your face/neck. Please follow these instructions carefully:  1.  Shower with CHG Soap the night before surgery and the  morning of Surgery.  2.  If you choose to wash your hair, wash your hair first as usual with your  normal  shampoo.  3.  After you shampoo, rinse your hair and body thoroughly to remove the  shampoo.                           4.  Use CHG as you would any other liquid soap.  You can apply chg directly  to the skin and wash                       Gently with a scrungie or clean  washcloth.  5.  Apply the CHG Soap to your body ONLY FROM THE NECK DOWN.   Do not use on face/ open                           Wound or open sores. Avoid contact with eyes, ears mouth and genitals (private parts).                       Wash face,  Genitals (private parts) with your normal soap.             6.  Wash thoroughly, paying special attention to the area where your surgery  will be performed.  7.  Thoroughly rinse your body with warm water from the neck down.  8.  DO NOT shower/wash with your normal soap after using and rinsing off  the CHG Soap.                9.  Pat yourself dry with a clean towel.            10.  Wear clean pajamas.            11.  Place clean sheets on your bed the night of your first shower and do not  sleep with pets. Day of Surgery : Do not apply any lotions/deodorants the morning of surgery.  Please wear clean clothes to the hospital/surgery center.  FAILURE TO FOLLOW THESE INSTRUCTIONS MAY RESULT IN THE CANCELLATION OF YOUR SURGERY PATIENT SIGNATURE_________________________________  NURSE SIGNATURE__________________________________  ________________________________________________________________________    Brittany Morris  An incentive spirometer is a tool that can help keep your lungs clear and active. This tool measures how well you are filling your lungs with each breath. Taking long deep breaths may help reverse or decrease the chance of developing breathing (pulmonary) problems (especially infection) following: A long period of time when you are unable to move or be active. BEFORE THE PROCEDURE  If the spirometer includes an indicator to show your best effort, your nurse or respiratory therapist will set it to a desired goal. If possible, sit up straight or lean slightly forward. Try not to slouch. Hold the incentive spirometer in an upright  position. INSTRUCTIONS FOR USE  Sit on the edge of your bed if possible, or sit up as far as you can  in bed or on a chair. Hold the incentive spirometer in an upright position. Breathe out normally. Place the mouthpiece in your mouth and seal your lips tightly around it. Breathe in slowly and as deeply as possible, raising the piston or the ball toward the top of the column. Hold your breath for 3-5 seconds or for as long as possible. Allow the piston or ball to fall to the bottom of the column. Remove the mouthpiece from your mouth and breathe out normally. Rest for a few seconds and repeat Steps 1 through 7 at least 10 times every 1-2 hours when you are awake. Take your time and take a few normal breaths between deep breaths. The spirometer may include an indicator to show your best effort. Use the indicator as a goal to work toward during each repetition. After each set of 10 deep breaths, practice coughing to be sure your lungs are clear. If you have an incision (the cut made at the time of surgery), support your incision when coughing by placing a pillow or rolled up towels firmly against it. Once you are able to get out of bed, walk around indoors and cough well. You may stop using the incentive spirometer when instructed by your caregiver.  RISKS AND COMPLICATIONS Take your time so you do not get dizzy or light-headed. If you are in pain, you may need to take or ask for pain medication before doing incentive spirometry. It is harder to take a deep breath if you are having pain. AFTER USE Rest and breathe slowly and easily. It can be helpful to keep track of a log of your progress. Your caregiver can provide you with a simple table to help with this. If you are using the spirometer at home, follow these instructions: SEEK MEDICAL CARE IF:  You are having difficultly using the spirometer. You have trouble using the spirometer as often as instructed. Your pain medication is not giving enough relief while using the spirometer. You develop fever of 100.5 F (38.1 C) or higher. SEEK  IMMEDIATE MEDICAL CARE IF:  You cough up bloody sputum that had not been present before. You develop fever of 102 F (38.9 C) or greater. You develop worsening pain at or near the incision site. MAKE SURE YOU:  Understand these instructions. Will watch your condition. Will get help right away if you are not doing well or get worse. Document Released: 12/17/2006 Document Revised: 10/29/2011 Document Reviewed: 02/17/2007 [facility] Patient Information 2014 [place], [place].   ________________________________________________________________________

## 2021-05-18 ENCOUNTER — Other Ambulatory Visit: Payer: Self-pay

## 2021-05-18 ENCOUNTER — Encounter: Payer: Medicare Other | Admitting: Orthopaedic Surgery

## 2021-05-18 ENCOUNTER — Encounter (HOSPITAL_COMMUNITY)
Admission: RE | Admit: 2021-05-18 | Discharge: 2021-05-18 | Disposition: A | Discharge status: Home / Self Care | Payer: Medicare Other | Source: Ambulatory Visit | Attending: Orthopaedic Surgery | Admitting: Orthopaedic Surgery

## 2021-05-18 ENCOUNTER — Other Ambulatory Visit: Payer: Self-pay | Admitting: Physician Assistant

## 2021-05-18 ENCOUNTER — Encounter (HOSPITAL_COMMUNITY): Payer: Self-pay

## 2021-05-18 DIAGNOSIS — Z79899 Other long term (current) drug therapy: Secondary | ICD-10-CM | POA: Insufficient documentation

## 2021-05-18 DIAGNOSIS — Z01812 Encounter for preprocedural laboratory examination: Secondary | ICD-10-CM | POA: Diagnosis not present

## 2021-05-18 LAB — GLUCOSE, CAPILLARY: Glucose-Capillary: 67 mg/dL — ABNORMAL LOW (ref 70–99)

## 2021-05-18 NOTE — Progress Notes (Addendum)
COVID Vaccine Completed: Yes Date COVID Vaccine completed: 03/31/20. X 2 COVID vaccine manufacturer: Moderna    COVID Test: N/A. Positive COVID results on: 05/03/21. EPIC  PCP - Dr. Betti Cruz: [facility]  Cardiologist - NO  Chest x-ray -  EKG - 04/26/21: EPIC Stress Test - 06/09/20 ECHO -  Cardiac Cath -  Pacemaker/ICD device last checked: RLA: pain medicine pump. Sleep Study - Y CPAP - N A1C: 6.4: 04/26/21 Fasting Blood Sugar - 80-200's Checks Blood Sugar times a day : continuos CBG monitor.  Blood Thinner Instructions: Aspirin Instructions: Last Dose:  Anesthesia review: HTN,DIA,pain medicine pump.  Patient denies shortness of breath, fever, cough and chest pain at PAT appointment   Patient verbalized understanding of instructions that were given to them at the PAT appointment. Patient was also instructed that they will need to review over the PAT instructions again at home before surgery.

## 2021-05-19 LAB — SURGICAL PCR SCREEN
MRSA, PCR: POSITIVE — AB
Staphylococcus aureus: POSITIVE — AB

## 2021-05-19 NOTE — Telephone Encounter (Signed)
See messages from patient... °

## 2021-05-24 NOTE — Anesthesia Preprocedure Evaluation (Addendum)
Anesthesia Evaluation  Patient identified by MRN, date of birth, ID band Patient awake    Reviewed: Allergy & Precautions, H&P , NPO status , Patient's Chart, lab work & pertinent test results  History of Anesthesia Complications (+) history of anesthetic complications  Airway Mallampati: II  TM Distance: >3 FB Neck ROM: Full    Dental no notable dental hx. (+) Edentulous Upper, Edentulous Lower   Pulmonary neg pulmonary ROS, former smoker,    Pulmonary exam normal breath sounds clear to auscultation       Cardiovascular Exercise Tolerance: Good hypertension, Pt. on medications negative cardio ROS Normal cardiovascular exam Rhythm:Regular Rate:Normal  EKG: 04/26/2021 Rate 87 bpm  NSR Minimal voltage criteria for LVH, may be normal variant Possible anterior infarct, age undetermined   Stress Test 06/15/2020 IMPRESSIONS:  Low risk probably normal myocardial perfusion study  There is a small in size, moderate fixed perfusion abnormality involving the apical and apical anterior segments. This is consistent with probable artifact given normal wall motion in this region.  Left ventricular systolic function is normal. Post stress the ejection fraction is > 65%. The left ventricle is normal in size.  No significant coronary calcifications were noted on the attenuation CT  Incidental CT findings: Incidental note is made of a patulous esophagus.   Neuro/Psych  Headaches, negative neurological ROS  negative psych ROS   GI/Hepatic negative GI ROS, Neg liver ROS,   Endo/Other  negative endocrine ROSdiabetes  Renal/GU negative Renal ROS  negative genitourinary   Musculoskeletal negative musculoskeletal ROS (+) Arthritis , Osteoarthritis,    Abdominal   Peds negative pediatric ROS (+)  Hematology negative hematology ROS (+)   Anesthesia Other Findings PROCEDURE:     DXR - DXR LUMBAR SPINE WITH OBLIQUES  - Nov 13 2004   2:43PM   RESULT:     Multiple views reveal normal alignment and curvature.  The  visualized bony skeleton and intervertebral disk spaces are intact.  No  spondylolysis and/or spondylolisthesis is noted.  Again seen is the thecal  sac catheter that appears to include the spinal canal at approximately  T12-L1 level posteriorly.  The catheter appears intact.    Reproductive/Obstetrics negative OB ROS                          Anesthesia Physical Anesthesia Plan  ASA: 3  Anesthesia Plan: MAC and Spinal   Post-op Pain Management:  Regional for Post-op pain   Induction: Intravenous  PONV Risk Score and Plan: 2 and Ondansetron and Treatment may vary due to age or medical condition  Airway Management Planned: Nasal Cannula, Natural Airway, Simple Face Mask and Mask  Additional Equipment: None  Intra-op Plan:   Post-operative Plan:   Informed Consent: I have reviewed the patients History and Physical, chart, labs and discussed the procedure including the risks, benefits and alternatives for the proposed anesthesia with the patient or authorized representative who has indicated his/her understanding and acceptance.       Plan Discussed with: Anesthesiologist and CRNA  Anesthesia Plan Comments: (55 y.o. former smoker with h/o DM II, HTN, chronic pain, OSA, right knee OA scheduled for above procedure 05/05/2021 with Dr. Jean Rosenthal.   Pt with intrathecal morphine pump in place, 10.5 mg/day.  She additionally has Oxycodone 5 mg q6h prn prescribed.   Per previous anesthesia note 10/07/2019, "Appropriate for postop monitored bed in step-down unit. She has a Hx of OSA and non-compliance with regular  CPAP. Based on observation in PACU, consider this patient may have central sleep apnea. Recommend avoiding IV opioids when possible."  Long discussion with patient regarding spinal vs general.  Will attempt spinal. )      Anesthesia Quick Evaluation

## 2021-05-25 ENCOUNTER — Observation Stay (HOSPITAL_COMMUNITY): Payer: Medicare Other

## 2021-05-25 ENCOUNTER — Other Ambulatory Visit: Payer: Self-pay

## 2021-05-25 ENCOUNTER — Encounter (HOSPITAL_COMMUNITY)
Admission: RE | Disposition: A | Discharge status: Skilled Nursing Facility | Payer: Self-pay | Source: Home / Self Care | Attending: Orthopaedic Surgery

## 2021-05-25 ENCOUNTER — Encounter (HOSPITAL_COMMUNITY): Payer: Self-pay | Admitting: Orthopaedic Surgery

## 2021-05-25 ENCOUNTER — Inpatient Hospital Stay (HOSPITAL_COMMUNITY)
Admission: RE | Admit: 2021-05-25 | Discharge: 2021-06-02 | DRG: 470 | Disposition: A | Discharge status: Skilled Nursing Facility | Payer: Medicare Other | Attending: Orthopaedic Surgery | Admitting: Orthopaedic Surgery

## 2021-05-25 ENCOUNTER — Ambulatory Visit (HOSPITAL_COMMUNITY): Payer: Medicare Other | Admitting: Anesthesiology

## 2021-05-25 ENCOUNTER — Ambulatory Visit (HOSPITAL_COMMUNITY): Payer: Medicare Other | Admitting: Vascular Surgery

## 2021-05-25 DIAGNOSIS — Z883 Allergy status to other anti-infective agents status: Secondary | ICD-10-CM

## 2021-05-25 DIAGNOSIS — M25461 Effusion, right knee: Secondary | ICD-10-CM | POA: Diagnosis present

## 2021-05-25 DIAGNOSIS — M1711 Unilateral primary osteoarthritis, right knee: Secondary | ICD-10-CM

## 2021-05-25 DIAGNOSIS — Z90710 Acquired absence of both cervix and uterus: Secondary | ICD-10-CM

## 2021-05-25 DIAGNOSIS — E7800 Pure hypercholesterolemia, unspecified: Secondary | ICD-10-CM | POA: Diagnosis present

## 2021-05-25 DIAGNOSIS — Z881 Allergy status to other antibiotic agents status: Secondary | ICD-10-CM

## 2021-05-25 DIAGNOSIS — E119 Type 2 diabetes mellitus without complications: Secondary | ICD-10-CM | POA: Diagnosis present

## 2021-05-25 DIAGNOSIS — I10 Essential (primary) hypertension: Secondary | ICD-10-CM | POA: Diagnosis present

## 2021-05-25 DIAGNOSIS — Z20822 Contact with and (suspected) exposure to covid-19: Secondary | ICD-10-CM | POA: Diagnosis present

## 2021-05-25 DIAGNOSIS — Z8616 Personal history of COVID-19: Secondary | ICD-10-CM

## 2021-05-25 DIAGNOSIS — Z96651 Presence of right artificial knee joint: Secondary | ICD-10-CM

## 2021-05-25 DIAGNOSIS — M25761 Osteophyte, right knee: Secondary | ICD-10-CM | POA: Diagnosis present

## 2021-05-25 DIAGNOSIS — D869 Sarcoidosis, unspecified: Secondary | ICD-10-CM | POA: Diagnosis present

## 2021-05-25 DIAGNOSIS — G8929 Other chronic pain: Secondary | ICD-10-CM | POA: Diagnosis present

## 2021-05-25 DIAGNOSIS — M65861 Other synovitis and tenosynovitis, right lower leg: Secondary | ICD-10-CM | POA: Diagnosis present

## 2021-05-25 DIAGNOSIS — Z87891 Personal history of nicotine dependence: Secondary | ICD-10-CM

## 2021-05-25 DIAGNOSIS — Z9049 Acquired absence of other specified parts of digestive tract: Secondary | ICD-10-CM

## 2021-05-25 HISTORY — PX: TOTAL KNEE ARTHROPLASTY: SHX125

## 2021-05-25 LAB — GLUCOSE, CAPILLARY
Glucose-Capillary: 104 mg/dL — ABNORMAL HIGH (ref 70–99)
Glucose-Capillary: 150 mg/dL — ABNORMAL HIGH (ref 70–99)
Glucose-Capillary: 168 mg/dL — ABNORMAL HIGH (ref 70–99)
Glucose-Capillary: 212 mg/dL — ABNORMAL HIGH (ref 70–99)
Glucose-Capillary: 113 mg/dL — ABNORMAL HIGH (ref 70–99)

## 2021-05-25 LAB — TYPE AND SCREEN
ABO/RH(D): O POS
Antibody Screen: NEGATIVE

## 2021-05-25 LAB — ABO/RH: ABO/RH(D): O POS

## 2021-05-25 SURGERY — ARTHROPLASTY, KNEE, TOTAL
Anesthesia: Monitor Anesthesia Care | Site: Knee | Laterality: Right

## 2021-05-25 MED ORDER — ONDANSETRON HCL 4 MG/2ML IJ SOLN: 4.0000 mg | Freq: Once | INTRAMUSCULAR | Status: DC | PRN

## 2021-05-25 MED ORDER — PREDNISONE 5 MG PO TABS
2.5000 mg | ORAL_TABLET | Freq: Every day | ORAL | Status: DC
  Administered 2021-05-26 – 2021-06-02 (×8): 2.5 mg via ORAL
  Filled 2021-05-25 (×8): qty 1

## 2021-05-25 MED ORDER — BUPIVACAINE-EPINEPHRINE (PF) 0.25% -1:200000 IJ SOLN
INTRAMUSCULAR | Status: AC
  Filled 2021-05-25: qty 30

## 2021-05-25 MED ORDER — SIMVASTATIN 40 MG PO TABS
40.0000 mg | ORAL_TABLET | Freq: Every day | ORAL | Status: DC
  Administered 2021-05-25 – 2021-06-01 (×8): 40 mg via ORAL
  Filled 2021-05-25 (×8): qty 1

## 2021-05-25 MED ORDER — PANTOPRAZOLE SODIUM 40 MG PO TBEC
40.0000 mg | DELAYED_RELEASE_TABLET | Freq: Two times a day (BID) | ORAL | Status: DC
  Administered 2021-05-25 – 2021-06-02 (×16): 40 mg via ORAL
  Filled 2021-05-25 (×16): qty 1

## 2021-05-25 MED ORDER — ACETAMINOPHEN 325 MG PO TABS
325.0000 mg | ORAL_TABLET | Freq: Four times a day (QID) | ORAL | Status: DC | PRN
  Administered 2021-05-26 – 2021-06-02 (×8): 650 mg via ORAL
  Filled 2021-05-25 (×8): qty 2

## 2021-05-25 MED ORDER — STERILE WATER FOR IRRIGATION IR SOLN
Status: DC | PRN
  Administered 2021-05-25: 2000 mL

## 2021-05-25 MED ORDER — GABAPENTIN 300 MG PO CAPS
900.0000 mg | ORAL_CAPSULE | Freq: Two times a day (BID) | ORAL | Status: DC
  Administered 2021-05-25 – 2021-06-02 (×16): 900 mg via ORAL
  Filled 2021-05-25 (×16): qty 3

## 2021-05-25 MED ORDER — MEPERIDINE HCL 50 MG/ML IJ SOLN: 6.2500 mg | INTRAMUSCULAR | Status: DC | PRN

## 2021-05-25 MED ORDER — ACETAMINOPHEN 160 MG/5ML PO SOLN: 325.0000 mg | ORAL | Status: DC | PRN

## 2021-05-25 MED ORDER — METHOCARBAMOL 500 MG IVPB - SIMPLE MED
500.0000 mg | Freq: Four times a day (QID) | INTRAVENOUS | Status: DC | PRN
  Filled 2021-05-25: qty 50

## 2021-05-25 MED ORDER — OXYCODONE HCL 5 MG PO TABS
10.0000 mg | ORAL_TABLET | ORAL | Status: DC | PRN
  Administered 2021-05-25 – 2021-06-02 (×28): 15 mg via ORAL
  Filled 2021-05-25 (×30): qty 3

## 2021-05-25 MED ORDER — HYDROMORPHONE HCL 1 MG/ML IJ SOLN
1.0000 mg | INTRAMUSCULAR | Status: DC | PRN
  Administered 2021-05-25 (×2): 1 mg via INTRAVENOUS
  Administered 2021-05-25 – 2021-05-26 (×3): 2 mg via INTRAVENOUS
  Filled 2021-05-25: qty 2
  Filled 2021-05-25: qty 1
  Filled 2021-05-25 (×2): qty 2
  Filled 2021-05-25: qty 1

## 2021-05-25 MED ORDER — VITAMIN D 25 MCG (1000 UNIT) PO TABS
4000.0000 [IU] | ORAL_TABLET | Freq: Every day | ORAL | Status: DC
  Administered 2021-05-26 – 2021-06-02 (×8): 4000 [IU] via ORAL
  Filled 2021-05-25 (×8): qty 4

## 2021-05-25 MED ORDER — FUROSEMIDE 20 MG PO TABS
20.0000 mg | ORAL_TABLET | ORAL | Status: DC
  Administered 2021-05-26 – 2021-06-01 (×4): 20 mg via ORAL
  Filled 2021-05-25 (×4): qty 1

## 2021-05-25 MED ORDER — PROMETHAZINE HCL 25 MG PO TABS
12.5000 mg | ORAL_TABLET | Freq: Four times a day (QID) | ORAL | Status: DC | PRN
  Administered 2021-05-26: 12.5 mg via ORAL
  Filled 2021-05-25: qty 1

## 2021-05-25 MED ORDER — DIPHENHYDRAMINE HCL 12.5 MG/5ML PO ELIX
12.5000 mg | ORAL_SOLUTION | ORAL | Status: DC | PRN
  Filled 2021-05-25: qty 10

## 2021-05-25 MED ORDER — INSULIN NPH (HUMAN) (ISOPHANE) 100 UNIT/ML ~~LOC~~ SUSP
15.0000 [IU] | Freq: Every day | SUBCUTANEOUS | Status: DC
  Administered 2021-05-28 – 2021-06-01 (×5): 15 [IU] via SUBCUTANEOUS
  Filled 2021-05-25: qty 10

## 2021-05-25 MED ORDER — ACETAMINOPHEN 325 MG PO TABS: 325.0000 mg | ORAL_TABLET | ORAL | Status: DC | PRN

## 2021-05-25 MED ORDER — FENTANYL CITRATE PF 50 MCG/ML IJ SOSY
50.0000 ug | PREFILLED_SYRINGE | INTRAMUSCULAR | Status: DC
  Administered 2021-05-25: 100 ug via INTRAVENOUS
  Filled 2021-05-25: qty 2

## 2021-05-25 MED ORDER — PHENYLEPHRINE 40 MCG/ML (10ML) SYRINGE FOR IV PUSH (FOR BLOOD PRESSURE SUPPORT)
PREFILLED_SYRINGE | INTRAVENOUS | Status: DC | PRN
  Administered 2021-05-25: 80 ug via INTRAVENOUS

## 2021-05-25 MED ORDER — KETOROLAC TROMETHAMINE 15 MG/ML IJ SOLN
15.0000 mg | Freq: Four times a day (QID) | INTRAMUSCULAR | Status: AC
  Administered 2021-05-25 – 2021-05-26 (×4): 15 mg via INTRAVENOUS
  Filled 2021-05-25 (×4): qty 1

## 2021-05-25 MED ORDER — INSULIN ASPART 100 UNIT/ML IJ SOLN
0.0000 [IU] | Freq: Three times a day (TID) | INTRAMUSCULAR | Status: DC
  Administered 2021-05-25: 5 [IU] via SUBCUTANEOUS
  Administered 2021-05-27: 1 [IU] via SUBCUTANEOUS
  Administered 2021-05-28: 3 [IU] via SUBCUTANEOUS
  Administered 2021-05-28 – 2021-05-30 (×5): 2 [IU] via SUBCUTANEOUS
  Administered 2021-05-31 (×2): 3 [IU] via SUBCUTANEOUS
  Administered 2021-06-01 (×2): 5 [IU] via SUBCUTANEOUS
  Administered 2021-06-02: 1 [IU] via SUBCUTANEOUS

## 2021-05-25 MED ORDER — METOCLOPRAMIDE HCL 5 MG PO TABS: 5.0000 mg | ORAL_TABLET | Freq: Three times a day (TID) | ORAL | Status: DC | PRN

## 2021-05-25 MED ORDER — SODIUM CHLORIDE 0.9 % IR SOLN
Status: DC | PRN
  Administered 2021-05-25: 1000 mL

## 2021-05-25 MED ORDER — MIDAZOLAM HCL 2 MG/2ML IJ SOLN
1.0000 mg | INTRAMUSCULAR | Status: DC
  Administered 2021-05-25: 2 mg via INTRAVENOUS
  Filled 2021-05-25: qty 2

## 2021-05-25 MED ORDER — PROPOFOL 10 MG/ML IV BOLUS
INTRAVENOUS | Status: DC | PRN
  Administered 2021-05-25: 40 mg via INTRAVENOUS

## 2021-05-25 MED ORDER — SODIUM CHLORIDE 0.9 % IV SOLN: INTRAVENOUS | Status: DC

## 2021-05-25 MED ORDER — BUDESONIDE 3 MG PO CPEP
6.0000 mg | ORAL_CAPSULE | Freq: Every day | ORAL | Status: DC
  Administered 2021-05-26 – 2021-06-02 (×8): 6 mg via ORAL
  Filled 2021-05-25 (×9): qty 2

## 2021-05-25 MED ORDER — MIDAZOLAM HCL 2 MG/2ML IJ SOLN
INTRAMUSCULAR | Status: AC
  Filled 2021-05-25: qty 2

## 2021-05-25 MED ORDER — MAGNESIUM OXIDE -MG SUPPLEMENT 400 (240 MG) MG PO TABS
800.0000 mg | ORAL_TABLET | Freq: Every morning | ORAL | Status: DC
  Administered 2021-05-26 – 2021-06-02 (×8): 800 mg via ORAL
  Filled 2021-05-25 (×7): qty 2

## 2021-05-25 MED ORDER — METHOCARBAMOL 500 MG PO TABS
500.0000 mg | ORAL_TABLET | Freq: Four times a day (QID) | ORAL | Status: DC | PRN
  Administered 2021-05-25 – 2021-06-02 (×20): 500 mg via ORAL
  Filled 2021-05-25 (×20): qty 1

## 2021-05-25 MED ORDER — EMPAGLIFLOZIN 10 MG PO TABS
10.0000 mg | ORAL_TABLET | Freq: Every day | ORAL | Status: DC
  Administered 2021-05-26 – 2021-06-02 (×8): 10 mg via ORAL
  Filled 2021-05-25 (×8): qty 1

## 2021-05-25 MED ORDER — METOCLOPRAMIDE HCL 5 MG/ML IJ SOLN
5.0000 mg | Freq: Three times a day (TID) | INTRAMUSCULAR | Status: DC | PRN
  Administered 2021-05-26: 10 mg via INTRAVENOUS
  Filled 2021-05-25: qty 2

## 2021-05-25 MED ORDER — OXYCODONE HCL 5 MG PO TABS: 5.0000 mg | ORAL_TABLET | Freq: Once | ORAL | Status: DC | PRN

## 2021-05-25 MED ORDER — FENTANYL CITRATE PF 50 MCG/ML IJ SOSY: 25.0000 ug | PREFILLED_SYRINGE | INTRAMUSCULAR | Status: DC | PRN

## 2021-05-25 MED ORDER — PHENYLEPHRINE HCL-NACL 20-0.9 MG/250ML-% IV SOLN
INTRAVENOUS | Status: DC | PRN
  Administered 2021-05-25: 40 ug/min via INTRAVENOUS

## 2021-05-25 MED ORDER — BUPIVACAINE HCL (PF) 0.5 % IJ SOLN
INTRAMUSCULAR | Status: DC | PRN
  Administered 2021-05-25: 2.5 mL via INTRATHECAL

## 2021-05-25 MED ORDER — PHENYLEPHRINE HCL (PRESSORS) 10 MG/ML IV SOLN
INTRAVENOUS | Status: AC
  Filled 2021-05-25: qty 2

## 2021-05-25 MED ORDER — ONDANSETRON HCL 4 MG/2ML IJ SOLN
INTRAMUSCULAR | Status: DC | PRN
  Administered 2021-05-25: 4 mg via INTRAVENOUS

## 2021-05-25 MED ORDER — LACTATED RINGERS IV SOLN: INTRAVENOUS | Status: DC

## 2021-05-25 MED ORDER — TRANEXAMIC ACID-NACL 1000-0.7 MG/100ML-% IV SOLN
1000.0000 mg | INTRAVENOUS | Status: AC
  Administered 2021-05-25: 1000 mg via INTRAVENOUS
  Filled 2021-05-25: qty 100

## 2021-05-25 MED ORDER — BUPIVACAINE-EPINEPHRINE (PF) 0.5% -1:200000 IJ SOLN
INTRAMUSCULAR | Status: DC | PRN
  Administered 2021-05-25: 25 mL via PERINEURAL

## 2021-05-25 MED ORDER — CEFAZOLIN SODIUM-DEXTROSE 2-4 GM/100ML-% IV SOLN
2.0000 g | INTRAVENOUS | Status: AC
  Administered 2021-05-25: 2 g via INTRAVENOUS
  Filled 2021-05-25: qty 100

## 2021-05-25 MED ORDER — DIVALPROEX SODIUM 250 MG PO DR TAB
500.0000 mg | DELAYED_RELEASE_TABLET | Freq: Two times a day (BID) | ORAL | Status: DC
  Administered 2021-05-25 – 2021-06-02 (×16): 500 mg via ORAL
  Filled 2021-05-25 (×16): qty 2

## 2021-05-25 MED ORDER — ONDANSETRON HCL 4 MG/2ML IJ SOLN
INTRAMUSCULAR | Status: AC
  Filled 2021-05-25: qty 2

## 2021-05-25 MED ORDER — ORAL CARE MOUTH RINSE: 15.0000 mL | Freq: Once | OROMUCOSAL | Status: AC

## 2021-05-25 MED ORDER — OXYCODONE HCL 5 MG/5ML PO SOLN: 5.0000 mg | Freq: Once | ORAL | Status: DC | PRN

## 2021-05-25 MED ORDER — ONDANSETRON HCL 4 MG/2ML IJ SOLN
4.0000 mg | Freq: Four times a day (QID) | INTRAMUSCULAR | Status: DC | PRN
  Administered 2021-05-26 – 2021-05-27 (×2): 4 mg via INTRAVENOUS
  Filled 2021-05-25 (×2): qty 2

## 2021-05-25 MED ORDER — DOCUSATE SODIUM 100 MG PO CAPS
100.0000 mg | ORAL_CAPSULE | Freq: Two times a day (BID) | ORAL | Status: DC
  Administered 2021-05-25 – 2021-06-02 (×16): 100 mg via ORAL
  Filled 2021-05-25 (×16): qty 1

## 2021-05-25 MED ORDER — DULOXETINE HCL 60 MG PO CPEP
60.0000 mg | ORAL_CAPSULE | Freq: Two times a day (BID) | ORAL | Status: DC
  Administered 2021-05-25 – 2021-06-02 (×16): 60 mg via ORAL
  Filled 2021-05-25 (×16): qty 1

## 2021-05-25 MED ORDER — INSULIN LISPRO 100 UNIT/ML IJ SOLN: 5.0000 [IU] | Freq: Three times a day (TID) | INTRAMUSCULAR | Status: DC

## 2021-05-25 MED ORDER — VANCOMYCIN HCL 1500 MG/300ML IV SOLN
1500.0000 mg | INTRAVENOUS | Status: AC
  Administered 2021-05-25: 1500 mg via INTRAVENOUS
  Filled 2021-05-25: qty 300

## 2021-05-25 MED ORDER — PROPOFOL 500 MG/50ML IV EMUL
INTRAVENOUS | Status: DC | PRN
  Administered 2021-05-25: 80 ug/kg/min via INTRAVENOUS

## 2021-05-25 MED ORDER — HYDROMORPHONE HCL 2 MG PO TABS
4.0000 mg | ORAL_TABLET | ORAL | Status: DC | PRN
  Administered 2021-05-25 – 2021-05-30 (×8): 4 mg via ORAL
  Filled 2021-05-25 (×8): qty 2

## 2021-05-25 MED ORDER — PHENOL 1.4 % MT LIQD: 1.0000 | OROMUCOSAL | Status: DC | PRN

## 2021-05-25 MED ORDER — AMLODIPINE BESYLATE 5 MG PO TABS
5.0000 mg | ORAL_TABLET | Freq: Every day | ORAL | Status: DC
  Administered 2021-05-26 – 2021-06-02 (×8): 5 mg via ORAL
  Filled 2021-05-25 (×8): qty 1

## 2021-05-25 MED ORDER — POLYETHYLENE GLYCOL 3350 17 G PO PACK
17.0000 g | PACK | Freq: Every day | ORAL | Status: DC | PRN
  Administered 2021-05-30 – 2021-05-31 (×2): 17 g via ORAL
  Filled 2021-05-25 (×3): qty 1

## 2021-05-25 MED ORDER — MIDAZOLAM HCL 5 MG/5ML IJ SOLN
INTRAMUSCULAR | Status: DC | PRN
  Administered 2021-05-25: 2 mg via INTRAVENOUS

## 2021-05-25 MED ORDER — OXYCODONE HCL 5 MG PO TABS
5.0000 mg | ORAL_TABLET | ORAL | Status: DC | PRN
  Administered 2021-05-29 – 2021-05-30 (×3): 10 mg via ORAL
  Filled 2021-05-25 (×3): qty 2

## 2021-05-25 MED ORDER — ASPIRIN 81 MG PO CHEW
81.0000 mg | CHEWABLE_TABLET | Freq: Two times a day (BID) | ORAL | Status: DC
  Administered 2021-05-25 – 2021-05-27 (×4): 81 mg via ORAL
  Filled 2021-05-25 (×4): qty 1

## 2021-05-25 MED ORDER — LISINOPRIL 20 MG PO TABS
40.0000 mg | ORAL_TABLET | Freq: Every day | ORAL | Status: DC
  Administered 2021-05-27 – 2021-06-02 (×7): 40 mg via ORAL
  Filled 2021-05-25 (×7): qty 2

## 2021-05-25 MED ORDER — FERROUS SULFATE 325 (65 FE) MG PO TABS
325.0000 mg | ORAL_TABLET | Freq: Every day | ORAL | Status: DC
  Administered 2021-05-26 – 2021-06-02 (×8): 325 mg via ORAL
  Filled 2021-05-25 (×8): qty 1

## 2021-05-25 MED ORDER — 0.9 % SODIUM CHLORIDE (POUR BTL) OPTIME
TOPICAL | Status: DC | PRN
  Administered 2021-05-25: 1000 mL

## 2021-05-25 MED ORDER — INSULIN NPH (HUMAN) (ISOPHANE) 100 UNIT/ML ~~LOC~~ SUSP
15.0000 [IU] | Freq: Every day | SUBCUTANEOUS | Status: DC
  Filled 2021-05-25: qty 10

## 2021-05-25 MED ORDER — ONDANSETRON HCL 4 MG PO TABS
4.0000 mg | ORAL_TABLET | Freq: Four times a day (QID) | ORAL | Status: DC | PRN
  Administered 2021-05-28 – 2021-05-30 (×3): 4 mg via ORAL
  Filled 2021-05-25 (×3): qty 1

## 2021-05-25 MED ORDER — ASCORBIC ACID 500 MG PO TABS
1000.0000 mg | ORAL_TABLET | Freq: Every day | ORAL | Status: DC
  Administered 2021-05-26 – 2021-06-02 (×8): 1000 mg via ORAL
  Filled 2021-05-25 (×8): qty 2

## 2021-05-25 MED ORDER — POVIDONE-IODINE 10 % EX SWAB
2.0000 "application " | Freq: Once | CUTANEOUS | Status: AC
  Administered 2021-05-25: 2 via TOPICAL

## 2021-05-25 MED ORDER — ALUM & MAG HYDROXIDE-SIMETH 200-200-20 MG/5ML PO SUSP: 30.0000 mL | ORAL | Status: DC | PRN

## 2021-05-25 MED ORDER — MENTHOL 3 MG MT LOZG: 1.0000 | LOZENGE | OROMUCOSAL | Status: DC | PRN

## 2021-05-25 MED ORDER — INSULIN NPH (HUMAN) (ISOPHANE) 100 UNIT/ML ~~LOC~~ SUSP
20.0000 [IU] | Freq: Every day | SUBCUTANEOUS | Status: DC
  Administered 2021-05-27 – 2021-06-02 (×7): 20 [IU] via SUBCUTANEOUS
  Filled 2021-05-25: qty 10

## 2021-05-25 MED ORDER — BUPIVACAINE-EPINEPHRINE 0.25% -1:200000 IJ SOLN
INTRAMUSCULAR | Status: DC | PRN
  Administered 2021-05-25: 30 mL

## 2021-05-25 MED ORDER — VANCOMYCIN HCL 1500 MG/300ML IV SOLN
1500.0000 mg | Freq: Two times a day (BID) | INTRAVENOUS | Status: AC
  Administered 2021-05-25: 1500 mg via INTRAVENOUS
  Filled 2021-05-25: qty 300

## 2021-05-25 MED ORDER — MAGNESIUM OXIDE -MG SUPPLEMENT 400 (240 MG) MG PO TABS
400.0000 mg | ORAL_TABLET | Freq: Every day | ORAL | Status: DC
  Administered 2021-05-25 – 2021-06-01 (×8): 400 mg via ORAL
  Filled 2021-05-25 (×8): qty 1

## 2021-05-25 MED ORDER — CHLORHEXIDINE GLUCONATE 0.12 % MT SOLN
15.0000 mL | Freq: Once | OROMUCOSAL | Status: AC
  Administered 2021-05-25: 15 mL via OROMUCOSAL

## 2021-05-25 SURGICAL SUPPLY — 59 items
BAG COUNTER SPONGE SURGICOUNT (BAG) IMPLANT
BAG ZIPLOCK 12X15 (MISCELLANEOUS) ×2 IMPLANT
BENZOIN TINCTURE PRP APPL 2/3 (GAUZE/BANDAGES/DRESSINGS) IMPLANT
BLADE SAG 18X100X1.27 (BLADE) ×2 IMPLANT
BLADE SURG SZ10 CARB STEEL (BLADE) ×4 IMPLANT
BNDG CONFORM 6X.82 1P STRL (GAUZE/BANDAGES/DRESSINGS) ×4 IMPLANT
BNDG ELASTIC 6X10 VLCR STRL LF (GAUZE/BANDAGES/DRESSINGS) ×2 IMPLANT
BNDG ELASTIC 6X5.8 VLCR STR LF (GAUZE/BANDAGES/DRESSINGS) ×4 IMPLANT
BOWL SMART MIX CTS (DISPOSABLE) IMPLANT
CEMENT BONE REFOBACIN R1X40 US (Cement) ×4 IMPLANT
COOLER ICEMAN CLASSIC (MISCELLANEOUS) ×2 IMPLANT
COVER SURGICAL LIGHT HANDLE (MISCELLANEOUS) ×2 IMPLANT
CUFF TOURN SGL QUICK 34 (TOURNIQUET CUFF) ×1
CUFF TRNQT CYL 34X4.125X (TOURNIQUET CUFF) ×1 IMPLANT
DECANTER SPIKE VIAL GLASS SM (MISCELLANEOUS) IMPLANT
DRAPE INCISE IOBAN 66X45 STRL (DRAPES) ×6 IMPLANT
DRAPE U-SHAPE 47X51 STRL (DRAPES) ×2 IMPLANT
DRSG PAD ABDOMINAL 8X10 ST (GAUZE/BANDAGES/DRESSINGS) ×2 IMPLANT
DURAPREP 26ML APPLICATOR (WOUND CARE) ×2 IMPLANT
ELECT BLADE TIP CTD 4 INCH (ELECTRODE) ×2 IMPLANT
ELECT REM PT RETURN 15FT ADLT (MISCELLANEOUS) ×2 IMPLANT
FEMUR CMT CR STD SZ 8 RT KNEE (Joint) ×2 IMPLANT
FEMUR CMTD CR STD SZ 8 RT KNEE (Joint) ×1 IMPLANT
GAUZE SPONGE 4X4 12PLY STRL (GAUZE/BANDAGES/DRESSINGS) ×2 IMPLANT
GAUZE XEROFORM 1X8 LF (GAUZE/BANDAGES/DRESSINGS) IMPLANT
GLOVE SRG 8 PF TXTR STRL LF DI (GLOVE) ×2 IMPLANT
GLOVE SURG ENC MOIS LTX SZ7.5 (GLOVE) ×2 IMPLANT
GLOVE SURG NEOPR MICRO LF SZ8 (GLOVE) ×2 IMPLANT
GLOVE SURG UNDER POLY LF SZ8 (GLOVE) ×2
GOWN STRL REUS W/TWL XL LVL3 (GOWN DISPOSABLE) ×4 IMPLANT
HANDPIECE INTERPULSE COAX TIP (DISPOSABLE) ×1
HDLS TROCR DRIL PIN KNEE 75 (PIN) ×1
HOLDER FOLEY CATH W/STRAP (MISCELLANEOUS) IMPLANT
IMMOBILIZER KNEE 20 (SOFTGOODS) ×2
IMMOBILIZER KNEE 20 THIGH 36 (SOFTGOODS) ×1 IMPLANT
INSERT TIB ARTI SZ8-11 RT 11 (Joint) ×2 IMPLANT
KIT TURNOVER KIT A (KITS) ×2 IMPLANT
NS IRRIG 1000ML POUR BTL (IV SOLUTION) ×2 IMPLANT
PACK TOTAL KNEE CUSTOM (KITS) ×2 IMPLANT
PAD COLD SHLDR WRAP-ON (PAD) ×2 IMPLANT
PADDING CAST COTTON 6X4 STRL (CAST SUPPLIES) ×4 IMPLANT
PIN DRILL HDLS TROCAR 75 4PK (PIN) ×1 IMPLANT
PROTECTOR NERVE ULNAR (MISCELLANEOUS) ×2 IMPLANT
SCREW FEMALE HEX FIX 25X2.5 (ORTHOPEDIC DISPOSABLE SUPPLIES) ×2 IMPLANT
SET HNDPC FAN SPRY TIP SCT (DISPOSABLE) ×1 IMPLANT
SET PAD KNEE POSITIONER (MISCELLANEOUS) ×2 IMPLANT
STAPLER VISISTAT 35W (STAPLE) IMPLANT
STEM POLY PAT PLY 29M KNEE (Knees) ×2 IMPLANT
STEM TIBIA 5 DEG SZ E R KNEE (Knees) ×1 IMPLANT
STRIP CLOSURE SKIN 1/2X4 (GAUZE/BANDAGES/DRESSINGS) IMPLANT
SUT MNCRL AB 4-0 PS2 18 (SUTURE) IMPLANT
SUT VIC AB 0 CT1 27 (SUTURE) ×1
SUT VIC AB 0 CT1 27XBRD ANTBC (SUTURE) ×1 IMPLANT
SUT VIC AB 1 CT1 36 (SUTURE) ×4 IMPLANT
SUT VIC AB 2-0 CT1 27 (SUTURE) ×2
SUT VIC AB 2-0 CT1 TAPERPNT 27 (SUTURE) ×2 IMPLANT
TIBIA STEM 5 DEG SZ E R KNEE (Knees) ×2 IMPLANT
TRAY FOLEY MTR SLVR 16FR STAT (SET/KITS/TRAYS/PACK) IMPLANT
WATER STERILE IRR 1000ML POUR (IV SOLUTION) ×4 IMPLANT

## 2021-05-25 NOTE — Progress Notes (Signed)
AssistedDr. Oddono with right, ultrasound guided, adductor canal block. Side rails up, monitors on throughout procedure. See vital signs in flow sheet. Tolerated Procedure well.  

## 2021-05-25 NOTE — Anesthesia Procedure Notes (Signed)
Anesthesia Regional Block: Adductor canal block   Pre-Anesthetic Checklist: , timeout performed,  Correct Patient, Correct Site, Correct Laterality,  Correct Procedure, Correct Position, site marked,  Risks and benefits discussed,  Surgical consent,  Pre-op evaluation,  At surgeon's request and post-op pain management  Laterality: Right  Prep: chloraprep       Needles:  Injection technique: Single-shot  Needle Type: Echogenic Stimulator Needle     Needle Length: 5cm  Needle Gauge: 22     Additional Needles:   Procedures:, nerve stimulator,,, ultrasound used (permanent image in chart),,    Narrative:  Start time: 05/25/2021 8:15 AM End time: 05/25/2021 8:20 AM Injection made incrementally with aspirations every 5 mL.  Performed by: Personally  Anesthesiologist: Bethena Midget, MD  Additional Notes: Functioning IV was confirmed and monitors were applied.  A 54mm 22ga Arrow echogenic stimulator needle was used. Sterile prep and drape,hand hygiene and sterile gloves were used. Ultrasound guidance: relevant anatomy identified, needle position confirmed, local anesthetic spread visualized around nerve(s)., vascular puncture avoided.  Image printed for medical record. Negative aspiration and negative test dose prior to incremental administration of local anesthetic. The patient tolerated the procedure well.

## 2021-05-25 NOTE — Anesthesia Procedure Notes (Signed)
Spinal  Patient location during procedure: OR Start time: 05/25/2021 9:50 AM End time: 05/25/2021 10:00 AM Reason for block: surgical anesthesia Staffing Anesthesiologist: Bethena Midget, MD Preanesthetic Checklist Completed: patient identified, IV checked, site marked, risks and benefits discussed, surgical consent, monitors and equipment checked, pre-op evaluation and timeout performed Spinal Block Patient position: sitting Prep: DuraPrep Patient monitoring: heart rate, cardiac monitor, continuous pulse ox and blood pressure Approach: midline Location: L3-4 Injection technique: single-shot Needle Needle type: Sprotte  Needle gauge: 24 G Needle length: 9 cm Assessment Sensory level: T4 Events: CSF return

## 2021-05-25 NOTE — Evaluation (Signed)
Physical Therapy Evaluation Patient Details Name: Brittany Morris MRN: [id number] DOB: 1966/06/08 Today's Date: 05/25/2021  History of Present Illness  Patient is 55 y.o. female s/p Rt TKA on 05/25/21 with PMH significant for OA, DM, HTN, sarcodosis, back surgery.   Clinical Impression  Brittany Morris is a 55 y.o. female POD 0 s/p Rt TKA. Patient reports independence with mobility at baseline. Patient is now limited by functional impairments (see PT problem list below) and requires mod assist for bed mobility and transfers with RW. Patient was limited to sit<>stand with RW and mod+2 assist due to significant pain. She took small steps towards [facility] and demonstrated good use of UE's on walker to prevent buckling at Rt knee. Patient will benefit from continued skilled PT interventions to address impairments and progress towards PLOF. Patient's daughter is planning to stay 1 week with pt while she recovers. If pt does not progress well she may benefit from ST rehab at SNF, will continue to assess and update recommendations as needed. Acute PT will follow to progress mobility and stair training in preparation for safe discharge home.        Recommendations for follow up therapy are one component of a multi-disciplinary discharge planning process, led by the attending physician.  Recommendations may be updated based on patient status, additional functional criteria and insurance authorization.  Follow Up Recommendations Follow surgeon's recommendation for DC plan and follow-up therapies    Equipment Recommendations  Rolling walker with 5" wheels    Recommendations for Other Services       Precautions / Restrictions Precautions Precautions: None Restrictions Weight Bearing Restrictions: No RLE Weight Bearing: Weight bearing as tolerated      Mobility  Bed Mobility Overal bed mobility: Needs Assistance Bed Mobility: Supine to Sit;Sit to Supine     Supine to sit: Mod assist;HOB elevated;+2  for physical assistance Sit to supine: Mod assist;HOB elevated;+2 for physical assistance   General bed mobility comments: Mod +2 assist due to pain. Assist needed to bring Rt LE off EOB and to pivot hips/raise trunk with use of bed pad. cues for pt to use bed rail to assist in raising trunk upright. Mod assist to return to supine.    Transfers Overall transfer level: Needs assistance Equipment used: Rolling walker (2 wheeled) Transfers: Sit to/from Stand Sit to Stand: +2 safety/equipment;From elevated surface;Mod assist         General transfer comment: cues for hand placement/technique with power up from elevated EOB with +2 assist to complete rise and steady in standing. Pt able to take several small steps towards HOB. no buckling at Rt knee as pt using UE's well to reduce pressure/pain. cues for safe reach back to sit and extend Rt knee.  Ambulation/Gait                Stairs            Wheelchair Mobility    Modified Rankin (Stroke Patients Only)       Balance Overall balance assessment: Needs assistance Sitting-balance support: Feet supported Sitting balance-Leahy Scale: Good     Standing balance support: During functional activity;Bilateral upper extremity supported Standing balance-Leahy Scale: Fair                               Pertinent Vitals/Pain Pain Assessment: Faces Faces Pain Scale: Hurts worst Pain Location: Rt knee Pain Descriptors / Indicators: Aching;Discomfort;Sore Pain Intervention(s): Limited activity  within patient's tolerance;Monitored during session;Repositioned;Ice applied;Premedicated before session    Home Living Family/patient expects to be discharged to:: Private residence (Simultaneous filing. User may not have seen previous data.) Living Arrangements: Children (Simultaneous filing. User may not have seen previous data.) Available Help at Discharge: Family Type of Home: House Home Access: Stairs to  enter Entrance Stairs-Rails: Can reach both Entrance Stairs-Number of Steps: 5 Home Layout: One level Home Equipment: Tub bench;Grab bars - toilet      Prior Function Level of Independence: Independent               Hand Dominance   Dominant Hand: Right    Extremity/Trunk Assessment   Upper Extremity Assessment Upper Extremity Assessment: Overall WFL for tasks assessed    Lower Extremity Assessment Lower Extremity Assessment: RLE deficits/detail RLE Deficits / Details: good quad activation, no extensor lag with SLR RLE Sensation: WNL RLE Coordination: WNL    Cervical / Trunk Assessment Cervical / Trunk Assessment: Normal  Communication   Communication: No difficulties  Cognition Arousal/Alertness: Awake/alert Behavior During Therapy: WFL for tasks assessed/performed Overall Cognitive Status: Within Functional Limits for tasks assessed                                        General Comments      Exercises     Assessment/Plan    PT Assessment Patient needs continued PT services  PT Problem List Decreased strength;Decreased range of motion;Decreased activity tolerance;Decreased balance;Decreased mobility;Decreased knowledge of use of DME;Decreased knowledge of precautions;Pain       PT Treatment Interventions DME instruction;Gait training;Stair training;Functional mobility training;Therapeutic activities;Therapeutic exercise;Balance training;Patient/family education    PT Goals (Current goals can be found in the Care Plan section)  Acute Rehab PT Goals Patient Stated Goal: stop hurting PT Goal Formulation: With patient Time For Goal Achievement: 06/01/21 Potential to Achieve Goals: Good    Frequency 7X/week   Barriers to discharge        Co-evaluation               AM-PAC PT "6 Clicks" Mobility  Outcome Measure Help needed turning from your back to your side while in a flat bed without using bedrails?: A Little Help needed  moving from lying on your back to sitting on the side of a flat bed without using bedrails?: A Little Help needed moving to and from a bed to a chair (including a wheelchair)?: A Little Help needed standing up from a chair using your arms (e.g., wheelchair or bedside chair)?: A Little Help needed to walk in hospital room?: A Little Help needed climbing 3-5 steps with a railing? : A Little 6 Click Score: 18    End of Session Equipment Utilized During Treatment: Gait belt Activity Tolerance: Patient tolerated treatment well Patient left: with call bell/phone within reach;in bed;with bed alarm set Nurse Communication: Mobility status PT Visit Diagnosis: Muscle weakness (generalized) (M62.81);Difficulty in walking, not elsewhere classified (R26.2);Pain Pain - Right/Left: Right Pain - part of body: Knee    Time: 3762-8315 PT Time Calculation (min) (ACUTE ONLY): 22 min   Charges:   PT Evaluation $PT Eval Low Complexity: 1 Low          Brittany Morris, DPT Acute Rehabilitation Services Office [phone] Pager [phone]   Brittany Morris 05/25/2021, 5:33 PM

## 2021-05-25 NOTE — Brief Op Note (Signed)
05/25/2021  11:25 AM  PATIENT:  Brittany Morris  55 y.o. female  PRE-OPERATIVE DIAGNOSIS:  osteoarthritis right knee  POST-OPERATIVE DIAGNOSIS:  osteoarthritis right knee  PROCEDURE:  Procedure(s): RIGHT TOTAL KNEE ARTHROPLASTY (Right)  SURGEON:  Surgeon(s) and Role:    Kathryne Hitch, MD - Primary  PHYSICIAN ASSISTANT:  Rexene Edison, PA-C  ANESTHESIA:   local, regional, and spinal  COUNTS:  YES  TOURNIQUET:   Total Tourniquet Time Documented: Thigh (Right) - 57 minutes Total: Thigh (Right) - 57 minutes   DICTATION: .Other Dictation: Dictation Number [id number]  PLAN OF CARE: Admit for overnight observation  PATIENT DISPOSITION:  PACU - hemodynamically stable.   Delay start of Pharmacological VTE agent (>24hrs) due to surgical blood loss or risk of bleeding: no

## 2021-05-25 NOTE — Progress Notes (Signed)
Pt complains of high levels of surgical pain. Difficult to control.  Dr Magnus Ivan notified.

## 2021-05-25 NOTE — Transfer of Care (Signed)
Immediate Anesthesia Transfer of Care Note  Patient: Brittany Morris  Procedure(s) Performed: RIGHT TOTAL KNEE ARTHROPLASTY (Right: Knee)  Patient Location: PACU  Anesthesia Type:Spinal  Level of Consciousness: awake, alert  and oriented  Airway & Oxygen Therapy: Patient Spontanous Breathing and Patient connected to face mask oxygen  Post-op Assessment: Report given to RN and Post -op Vital signs reviewed and stable  Post vital signs: Reviewed and stable  Last Vitals:  Vitals Value Taken Time  BP 115/66 05/25/21 1150  Temp    Pulse 78 05/25/21 1157  Resp 20 05/25/21 1157  SpO2 95 % 05/25/21 1157  Vitals shown include unvalidated device data.  Last Pain:  Vitals:   05/25/21 0709  TempSrc: Oral  PainSc: 0-No pain         Complications: No notable events documented.

## 2021-05-25 NOTE — H&P (Signed)
TOTAL KNEE ADMISSION H&P  Patient is being admitted for right total knee arthroplasty.  Subjective:  Chief Complaint:right knee pain.  HPI: Brittany Morris, 55 y.o. female, has a history of pain and functional disability in the right knee due to arthritis and has failed non-surgical conservative treatments for greater than 12 weeks to includeNSAID's and/or analgesics, corticosteriod injections, flexibility and strengthening excercises, use of assistive devices, weight reduction as appropriate, and activity modification.  Onset of symptoms was gradual, starting 1 years ago with gradually worsening course since that time. The patient noted no past surgery on the right knee(s).  Patient currently rates pain in the right knee(s) at 10 out of 10 with activity. Patient has night pain, worsening of pain with activity and weight bearing, pain that interferes with activities of daily living, pain with passive range of motion, crepitus, and joint swelling.  Patient has evidence of subchondral sclerosis, periarticular osteophytes, and joint space narrowing by imaging studies. There is no active infection.  Patient Active Problem List   Diagnosis Date Noted   Unilateral primary osteoarthritis, right knee 03/06/2021   Acute respiratory failure with hypoxia (HCC) 11/29/2019   Hypertension    High cholesterol    Diabetes mellitus without complication (HCC)    COVID-19 virus infection    Hyponatremia    Nausea vomiting and diarrhea    Past Medical History:  Diagnosis Date   Arthritis    Complication of anesthesia    difficulty with breathing after and waking up   Diabetes mellitus without complication (HCC)    Headache    migraines   High cholesterol    Hypertension    Nerve pain    Pneumonia    Sarcoidosis     Past Surgical History:  Procedure Laterality Date   ABDOMINAL HYSTERECTOMY     APPENDECTOMY     BACK SURGERY     CESAREAN SECTION     x3   CHOLECYSTECTOMY     NERVE EXPLORATION       Current Facility-Administered Medications  Medication Dose Route Frequency Provider Last Rate Last Admin   ceFAZolin (ANCEF) IVPB 2g/100 mL premix  2 g Intravenous On Call to OR Kirtland Bouchard, PA-C       fentaNYL (SUBLIMAZE) injection 50-100 mcg  50-100 mcg Intravenous Rowland Lathe, MD   100 mcg at 05/25/21 0820   lactated ringers infusion   Intravenous Continuous Ellender, Catheryn Bacon, MD 10 mL/hr at 05/25/21 0803 Continued from Pre-op at 05/25/21 0803   midazolam (VERSED) injection 1-2 mg  1-2 mg Intravenous Rowland Lathe, MD   2 mg at 05/25/21 0820   tranexamic acid (CYKLOKAPRON) IVPB 1,000 mg  1,000 mg Intravenous To OR Richardean Canal W, PA-C       vancomycin (VANCOREADY) IVPB 1500 mg/300 mL  1,500 mg Intravenous 60 min Pre-Op Kathryne Hitch, MD       Allergies  Allergen Reactions   Baclofen     Dizziness    Ciprofloxacin Rash    Social History   Tobacco Use   Smoking status: Former   Smokeless tobacco: Never  Substance Use Topics   Alcohol use: Not Currently    Family History  Problem Relation Age of Onset   Heart disease Mother      Review of Systems  Musculoskeletal:  Positive for gait problem and joint swelling.  All other systems reviewed and are negative.  Objective:  Physical Exam Vitals reviewed.  Constitutional:  Appearance: Normal appearance.  HENT:     Head: Normocephalic and atraumatic.  Eyes:     Extraocular Movements: Extraocular movements intact.     Pupils: Pupils are equal, round, and reactive to light.  Cardiovascular:     Rate and Rhythm: Normal rate and regular rhythm.  Pulmonary:     Effort: Pulmonary effort is normal.     Breath sounds: Normal breath sounds.  Abdominal:     Palpations: Abdomen is soft.  Musculoskeletal:     Cervical back: Normal range of motion and neck supple.     Right knee: Effusion, bony tenderness and crepitus present. Decreased range of motion. Tenderness present over the medial joint line,  lateral joint line and patellar tendon. Abnormal alignment.  Neurological:     Mental Status: She is alert and oriented to person, place, and time.  Psychiatric:        Behavior: Behavior normal.    Vital signs in last 24 hours: Temp:  [98.5 F (36.9 C)] 98.5 F (36.9 C) (10/06 0709) Pulse Rate:  [74-86] 84 (10/06 0830) Resp:  [8-20] 14 (10/06 0830) BP: (107-130)/(73-82) 118/77 (10/06 0825) SpO2:  [88 %-100 %] 99 % (10/06 0830) Weight:  [109.3 kg] 109.3 kg (10/06 0709)  Labs:   Estimated body mass index is 38.9 kg/m as calculated from the following:   Height as of this encounter: 5\' 6"  (1.676 m).   Weight as of this encounter: 109.3 kg.   Imaging Review Plain radiographs demonstrate severe degenerative joint disease of the right knee(s). The overall alignment ismild varus. The bone quality appears to be good for age and reported activity level.      Assessment/Plan:  End stage arthritis, right knee   The patient history, physical examination, clinical judgment of the provider and imaging studies are consistent with end stage degenerative joint disease of the right knee(s) and total knee arthroplasty is deemed medically necessary. The treatment options including medical management, injection therapy arthroscopy and arthroplasty were discussed at length. The risks and benefits of total knee arthroplasty were presented and reviewed. The risks due to aseptic loosening, infection, stiffness, patella tracking problems, thromboembolic complications and other imponderables were discussed. The patient acknowledged the explanation, agreed to proceed with the plan and consent was signed. Patient is being admitted for inpatient treatment for surgery, pain control, PT, OT, prophylactic antibiotics, VTE prophylaxis, progressive ambulation and ADL's and discharge planning. The patient is planning to be discharged home with home health services

## 2021-05-25 NOTE — Anesthesia Postprocedure Evaluation (Signed)
Anesthesia Post Note  Patient: Brittany Morris  Procedure(s) Performed: RIGHT TOTAL KNEE ARTHROPLASTY (Right: Knee)     Patient location during evaluation: PACU Anesthesia Type: MAC Level of consciousness: awake and alert Pain management: pain level controlled Vital Signs Assessment: post-procedure vital signs reviewed and stable Respiratory status: spontaneous breathing, nonlabored ventilation, respiratory function stable and patient connected to nasal cannula oxygen Cardiovascular status: stable and blood pressure returned to baseline Postop Assessment: no apparent nausea or vomiting Anesthetic complications: no   No notable events documented.  Last Vitals:  Vitals:   05/25/21 1325 05/25/21 1345  BP:  108/69  Pulse:  72  Resp:  15  Temp:    SpO2: 95% 95%    Last Pain:  Vitals:   05/25/21 1345  TempSrc:   PainSc: 0-No pain                 [name]

## 2021-05-26 LAB — BASIC METABOLIC PANEL
Anion gap: 8 (ref 5–15)
BUN: 12 mg/dL (ref 6–20)
CO2: 26 mmol/L (ref 22–32)
Calcium: 8.2 mg/dL — ABNORMAL LOW (ref 8.9–10.3)
Chloride: 103 mmol/L (ref 98–111)
Creatinine, Ser: 0.76 mg/dL (ref 0.44–1.00)
GFR, Estimated: >60 mL/min (ref >60)
Glucose, Bld: 161 mg/dL — ABNORMAL HIGH (ref 70–99)
Potassium: 3.9 mmol/L (ref 3.5–5.1)
Sodium: 137 mmol/L (ref 135–145)

## 2021-05-26 LAB — CBC
HCT: 41.8 % (ref 36.0–46.0)
Hemoglobin: 13 g/dL (ref 12.0–15.0)
MCH: 29.7 pg (ref 26.0–34.0)
MCHC: 31.1 g/dL (ref 30.0–36.0)
MCV: 95.4 fL (ref 80.0–100.0)
Platelets: 152 10*3/uL (ref 150–400)
RBC: 4.38 MIL/uL (ref 3.87–5.11)
RDW: 14.4 % (ref 11.5–15.5)
WBC: 7.8 10*3/uL (ref 4.0–10.5)
nRBC: 0 % (ref 0.0–0.2)

## 2021-05-26 LAB — GLUCOSE, CAPILLARY
Glucose-Capillary: 164 mg/dL — ABNORMAL HIGH (ref 70–99)
Glucose-Capillary: 122 mg/dL — ABNORMAL HIGH (ref 70–99)
Glucose-Capillary: 140 mg/dL — ABNORMAL HIGH (ref 70–99)
Glucose-Capillary: 122 mg/dL — ABNORMAL HIGH (ref 70–99)

## 2021-05-26 MED ORDER — MEPERIDINE HCL 25 MG/ML IJ SOLN: 50.0000 mg | Freq: Once | INTRAMUSCULAR | Status: DC

## 2021-05-26 NOTE — Progress Notes (Signed)
Subjective: 1 Day Post-Op Procedure(s) (LRB): RIGHT TOTAL KNEE ARTHROPLASTY (Right) Patient reports pain as severe.  This is limiting her mobility  Objective: Vital signs in last 24 hours: Temp:  [98 F (36.7 C)-99.5 F (37.5 C)] 98.4 F (36.9 C) (10/07 1007) Pulse Rate:  [80-110] 95 (10/07 1007) Resp:  [14-18] 16 (10/07 1007) BP: (118-159)/(65-88) 141/80 (10/07 1007) SpO2:  [88 %-100 %] 88 % (10/07 1025)  Intake/Output from previous day: 10/06 0701 - 10/07 0700 In: 3538 [P.O.:1160; I.V.:1878; IV Piggyback:500] Out: 3100 [Urine:3000; Blood:100] Intake/Output this shift: No intake/output data recorded.  Recent Labs    05/26/21 0334  HGB 13.0   Recent Labs    05/26/21 0334  WBC 7.8  RBC 4.38  HCT 41.8  PLT 152   Recent Labs    05/26/21 0334  NA 137  K 3.9  CL 103  CO2 26  BUN 12  CREATININE 0.76  GLUCOSE 161*  CALCIUM 8.2*   No results for input(s): LABPT, INR in the last 72 hours.  Sensation intact distally Intact pulses distally Dorsiflexion/Plantar flexion intact Incision: scant drainage   Assessment/Plan: 1 Day Post-Op Procedure(s) (LRB): RIGHT TOTAL KNEE ARTHROPLASTY (Right) Up with therapy Will need to work on pain control. Not safe for discharge to home today.     Kathryne Hitch 05/26/2021, 2:25 PM

## 2021-05-26 NOTE — Plan of Care (Signed)
  Problem: Coping: Goal: Level of anxiety will decrease Outcome: Progressing   Problem: Pain Managment: Goal: General experience of comfort will improve Outcome: Progressing   Problem: Safety: Goal: Ability to remain free from injury will improve Outcome: Progressing   

## 2021-05-26 NOTE — Progress Notes (Signed)
Physical Therapy Treatment Patient Details Name: Brittany Morris MRN: [id number] DOB: 1965/10/13 Today's Date: 05/26/2021   History of Present Illness Patient is 55 y.o. female s/p Rt TKA on 05/25/21 with PMH significant for OA, DM, HTN, sarcodosis, back surgery.    PT Comments    Pt reports R knee pain is 10/10, she had 15 mg of oxycodone 1 hour prior to PT session. 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 Pain limiting activity tolerance. Max assist for supine to sit, min A sit to stand. Pt unable to ambulate 2* pain, nausea, dizziness, so was assisted back to bed. SaO2 88% on room air, 95% on room air with pursed lip breathing.    Recommendations for follow up therapy are one component of a multi-disciplinary discharge planning process, led by the attending physician.  Recommendations may be updated based on patient status, additional functional criteria and insurance authorization.  Follow Up Recommendations  Follow surgeon's recommendation for DC plan and follow-up therapies     Equipment Recommendations  Rolling walker with 5" wheels    Recommendations for Other Services       Precautions / Restrictions Precautions Precautions: Knee Precaution Booklet Issued: Yes (comment) Precaution Comments: reviewed no pillow under knee Restrictions Weight Bearing Restrictions: No RLE Weight Bearing: Weight bearing as tolerated     Mobility  Bed Mobility Overal bed mobility: Needs Assistance Bed Mobility: Supine to Sit;Sit to Supine     Supine to sit: Max assist Sit to supine: Mod assist   General bed mobility comments: assist to advance LEs, pivot hips, and raise trunk. 10/10 pain limiting activity tolerance.    Transfers Overall transfer level: Needs assistance Equipment used: Rolling walker (2 wheeled) Transfers: Sit to/from Stand Sit to Stand: Min assist;From elevated surface         General transfer comment: VCs hand  placement, min A to power up, increased time/effort, pt unable to take a step with LLE despite max VCs for technique. She also reported dizziness and nausea, so was assisted back to bed. SaO2 88% on room air at rest, came up to 95% with pursed lip breathing.  Ambulation/Gait                 Stairs             Wheelchair Mobility    Modified Rankin (Stroke Patients Only)       Balance Overall balance assessment: Needs assistance Sitting-balance support: Feet supported Sitting balance-Leahy Scale: Good     Standing balance support: During functional activity;Bilateral upper extremity supported Standing balance-Leahy Scale: Fair                              Cognition Arousal/Alertness: Awake/alert Behavior During Therapy: WFL for tasks assessed/performed Overall Cognitive Status: Within Functional Limits for tasks assessed                                        Exercises Total Joint Exercises Ankle Circles/Pumps: AROM;Both;10 reps;Supine    General Comments        Pertinent Vitals/Pain Pain Assessment: 0-10 Pain Score: 10-Worst pain ever Pain Location: Rt knee Pain Descriptors / Indicators: Aching;Discomfort;Sore Pain Intervention(s): Ice applied;Limited activity within patient's tolerance;Monitored during session;Premedicated before session    Home Living  Prior Function            PT Goals (current goals can now be found in the care plan section) Acute Rehab PT Goals Patient Stated Goal: stop hurting, play with grandkids PT Goal Formulation: With patient Time For Goal Achievement: 06/01/21 Potential to Achieve Goals: Good Progress towards PT goals: Not progressing toward goals - comment    Frequency    7X/week      PT Plan Current plan remains appropriate    Co-evaluation              AM-PAC PT "6 Clicks" Mobility   Outcome Measure  Help needed turning from your back  to your side while in a flat bed without using bedrails?: A Little Help needed moving from lying on your back to sitting on the side of a flat bed without using bedrails?: A Little Help needed moving to and from a bed to a chair (including a wheelchair)?: Total Help needed standing up from a chair using your arms (e.g., wheelchair or bedside chair)?: A Lot Help needed to walk in hospital room?: Total Help needed climbing 3-5 steps with a railing? : Total 6 Click Score: 11    End of Session Equipment Utilized During Treatment: Gait belt Activity Tolerance: Patient limited by pain Patient left: with call bell/phone within reach;in bed;with bed alarm set Nurse Communication: Mobility status PT Visit Diagnosis: Muscle weakness (generalized) (M62.81);Difficulty in walking, not elsewhere classified (R26.2);Pain Pain - Right/Left: Right Pain - part of body: Knee     Time: 1022-1038 PT Time Calculation (min) (ACUTE ONLY): 16 min  Charges:  $Therapeutic Activity: 8-22 mins                    Ralene Bathe Kistler PT 05/26/2021  Acute Rehabilitation Services Pager [phone] Office [phone]

## 2021-05-26 NOTE — Plan of Care (Signed)
°  Problem: Education: °Goal: Knowledge of General Education information will improve °Description: Including pain rating scale, medication(s)/side effects and non-pharmacologic comfort measures °Outcome: Progressing °  °Problem: Coping: °Goal: Level of anxiety will decrease °Outcome: Progressing °  °Problem: Pain Managment: °Goal: General experience of comfort will improve °Outcome: Progressing °  °Problem: Safety: °Goal: Ability to remain free from injury will improve °Outcome: Progressing °  °Problem: Clinical Measurements: °Goal: Postoperative complications will be avoided or minimized °Outcome: Progressing °  °

## 2021-05-26 NOTE — Op Note (Signed)
NAME: Brittany Morris, Brittany Morris MEDICAL RECORD NO: [id number] ACCOUNT NO: [id number] DATE OF BIRTH: 1965/10/24 FACILITY: [facility] LOCATION: [id number] PHYSICIAN: Vanita Panda. Magnus Ivan, MD  Operative Report   DATE OF PROCEDURE: 05/25/2021  PREOPERATIVE DIAGNOSES:  Primary osteoarthritis and degenerative joint disease, right knee with significant varus malalignment.  POSTOPERATIVE DIAGNOSES:  Primary osteoarthritis and degenerative joint disease, right knee with significant varus malalignment.  PROCEDURE:  Right total knee arthroplasty.  IMPLANTS:  Biomet/Zimmer Persona knee system with size 8 CR right femur, size E right tibial tray, 11 mm medial congruent polyethylene insert fixed bearing, size 29 patellar button.  SURGEON:  Doneen Poisson, MD  ASSISTANT:  Richardean Canal, PA-C  ANESTHESIA: 1. Right lower extremity adductor canal block. 2.  Spinal. 3.  Local around the arthrotomy with 0.25% Marcaine with epinephrine.  TOURNIQUET TIME:  Under one hour.  ANTIBIOTICS:  2 grams IV Ancef and 1 g IV vancomycin.  ESTIMATED BLOOD LOSS:  100 mL.  COMPLICATIONS:  None.  INDICATIONS:  The patient is a 55 year old female well known to me.  She has a BMI of 38 and significant varus malalignment of her right knee with bone-on-bone wear x-ray wise.  Her clinical exam also shows severe pain with varus malalignment of the  knee.  She has tried and failed all forms of conservative treatment and at this point, her right knee pain is daily and it is detrimentally affecting her mobility, her quality of life and activities of daily living.  We have talked in length in detail  about the possibility of knee replacement surgery.  We had a long and thorough discussion about the risk of acute blood loss anemia, nerve or vessel injury, DVT, infection, implant failure and skin and soft tissue issues.  We talked about her goals  hopefully being decreased pain, improve mobility and overall improve quality of  life.  DESCRIPTION OF PROCEDURE:  After informed consent was obtained, appropriate right knee was marked.  Anesthesia obtained.  Had right lower extremity adductor canal block in the holding room.  She was then brought to the operating room and sat up on the  operating table.  Spinal anesthesia was obtained.  She was then laid in supine position on the operating table.  Foley catheter was placed and a nonsterile tourniquet was placed around her upper right thigh.  Her right thigh, knee, leg, ankle and foot  were prepped and draped with DuraPrep and sterile drapes including a sterile stockinette.  A timeout was called.  She was identified as correct patient, correct right knee.  We then used Esmarch to wrap that leg and tourniquet was inflated to 300 mm of  pressure.  We made a direct midline incision over the patella and carried this proximally and distally.  We dissected down the knee joint, carried out a medial parapatellar arthrotomy, finding a large joint effusion.  There was significant synovitis in  the knee. With the knee in a flexed position, we found a complete loss of cartilage on the medial aspect of the knee and the patellofemoral joint.  We removed remnants of the ACL, medial and lateral meniscus as well as osteophytes in all three  compartments.  We then set our extramedullary cutting guide for a 3-degree slope correction of varus and valgus and taking 2 mm off the low side.  We made this cut without difficulty.  We then went to the femur, used an intramedullary guide for the femur  for setting our distal femoral cut for a right knee  at 5 degrees externally rotated and a 10 mm distal femoral cut.  We then brought the knee back down to full extension and removed more debris of the meniscus from the back of the knee and then placed  our 10 mm extension block and we achieved full extension.  We then went back to the femur and put our femoral sizing guide based off the epicondylar axis.  Based off  this, we chose a size 8 femur.  We put a 4-in-1 cutting block for a size 8 right femur.   We made our anterior and posterior cuts, followed by our chamfer cuts.  We then went back to the tibia and chose a size E tibial tray for right knee for coverage.  We set the rotation of the tibial tubercle and the femur and did our keel punch and drill  hole off of this.  With a size E right trial tibia, we trialed a right 8 femur and a 10 mm medial congruent polyethylene insert.  We went up to an 11 mm insert and we felt like that will give stability throughout the flexion and extension arc and had  equal flexion and extension gaps.  The knee felt stable with varus and valgus stressing as well.  We then made our patellar cut and drilled 3 drill holes for a size 29 patellar button.  With all trial instrumentation in the knee, again I put it through  range of motion and we were pleased with range of motion.  We did our final lug drill holes through the femoral component and then removed all components from the knee ___.  We then irrigated the knee with normal saline solution using pulsatile lavage.   We dried the knee real well and placed our 0.25% Marcaine with epinephrine around the arthrotomy.  We then mixed our cement and with the knee in a flexed position, cemented our real Biomet Zimmer Persona tibial tray for right knee, size E followed by a  real size right 8 femur.  We placed our 11 mm medial congruent fixed bearing polyethylene insert and cemented our size 29 patellar button.  We then held the knee in a completely extended position and compressed the components while the cement hardened.   Once it was hardened, we removed excess cement debris from around the knee.  The tourniquet was let down and hemostasis was obtained with electrocautery.  We closed the arthrotomy with interrupted #1 Vicryl suture followed by 0 Vicryl for the deep tissue  and 2-0 Vicryl for the subcutaneous tissue.  The skin was  reapproximated with staples.  Xeroform well-padded sterile dressing was applied.  She was taken to recovery room in stable condition with all final counts being correct and no complications  noted.  Of note, Rexene Edison, PA-C did assist during the entire case and assistance was crucial for facilitating every aspect of this case.   NIK D: 05/25/2021 11:23:17 am T: 05/26/2021 12:53:00 am  JOB: [id number]/ [id number]

## 2021-05-26 NOTE — Progress Notes (Signed)
Physical Therapy Treatment Patient Details Name: Brittany Morris MRN: [id number] DOB: 11-19-1965 Today's Date: 05/26/2021   History of Present Illness Patient is 55 y.o. female s/p Rt TKA on 05/25/21 with PMH significant for OA, DM, HTN, sarcodosis, back surgery.    PT Comments    Pt is progressing slowly with mobility. Mod assist for supine to sit, min A to pivot to bedside commode. Pt was able to take several pivotal steps to the recliner, but maintained RLE in NWB position despite cues to attempt toe touch weight bearing. Pain limited tolerance of weightbearing. Instructed pt and her daughters in TKA HEP.    Recommendations for follow up therapy are one component of a multi-disciplinary discharge planning process, led by the attending physician.  Recommendations may be updated based on patient status, additional functional criteria and insurance authorization.  Follow Up Recommendations  Follow surgeon's recommendation for DC plan and follow-up therapies     Equipment Recommendations  Rolling walker with 5" wheels    Recommendations for Other Services       Precautions / Restrictions Precautions Precautions: Knee Precaution Booklet Issued: Yes (comment) Precaution Comments: reviewed no pillow under knee Restrictions Weight Bearing Restrictions: No RLE Weight Bearing: Weight bearing as tolerated     Mobility  Bed Mobility Overal bed mobility: Needs Assistance Bed Mobility: Supine to Sit     Supine to sit: Mod assist     General bed mobility comments: assist to advance LEs, pivot hips, and raise trunkm, pain limiting activity tolerance.    Transfers Overall transfer level: Needs assistance Equipment used: Rolling walker (2 wheeled) Transfers: Sit to/from [employer] Sit to Stand: Min assist;From elevated surface Stand pivot transfers: Min assist       General transfer comment: VCs hand placement, min A to power up, increased time/effort, SPT to  bedside commode, then a few pivotal steps to recliner, pt keeps RLE NWB  Ambulation/Gait Ambulation/Gait assistance: Min assist Gait Distance (Feet): 3 Feet Assistive device: Rolling walker (2 wheeled) Gait Pattern/deviations: Step-to pattern Gait velocity: decr   General Gait Details: pt keeps RLE in NWB position, encouraged pt to attempt at least TTWB, distance limited by pain in R knee and R groin (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), min A for balance (pt wobbled 2* keeping RLE NWB)   Stairs             Wheelchair Mobility    Modified Rankin (Stroke Patients Only)       Balance Overall balance assessment: Needs assistance Sitting-balance support: Feet supported Sitting balance-Leahy Scale: Good     Standing balance support: During functional activity;Bilateral upper extremity supported Standing balance-Leahy Scale: Fair Standing balance comment: min A during dynamic standing 2* LOB                            Cognition Arousal/Alertness: Awake/alert Behavior During Therapy: WFL for tasks assessed/performed Overall Cognitive Status: Within Functional Limits for tasks assessed                                        Exercises Total Joint Exercises Ankle Circles/Pumps: AROM;Both;10 reps;Supine Quad Sets: AROM;Right;10 reps;Supine Short Arc Quad: AROM;Right;10 reps;Supine Heel Slides: AAROM;Right;10 reps;Supine Hip ABduction/ADduction: AAROM;Right;10 reps;Supine Straight Leg Raises: AAROM;Right;10 reps;Supine    General Comments        Pertinent Vitals/Pain  Pain Score: 9  Pain Location: Rt knee and R groin Pain Descriptors / Indicators: Aching;Discomfort;Sore Pain Intervention(s): Limited activity within patient's tolerance;Monitored during session;Premedicated before session;Ice applied    Home Living                      Prior Function            PT Goals (current goals can now be found in  the care plan section) Acute Rehab PT Goals Patient Stated Goal: stop hurting, play with grandkids PT Goal Formulation: With patient Time For Goal Achievement: 06/01/21 Potential to Achieve Goals: Good Progress towards PT goals: Progressing toward goals    Frequency    7X/week      PT Plan Current plan remains appropriate    Co-evaluation              AM-PAC PT "6 Clicks" Mobility   Outcome Measure  Help needed turning from your back to your side while in a flat bed without using bedrails?: A Little Help needed moving from lying on your back to sitting on the side of a flat bed without using bedrails?: A Lot Help needed moving to and from a bed to a chair (including a wheelchair)?: A Little Help needed standing up from a chair using your arms (e.g., wheelchair or bedside chair)?: A Lot Help needed to walk in hospital room?: A Lot Help needed climbing 3-5 steps with a railing? : Total 6 Click Score: 13    End of Session Equipment Utilized During Treatment: Gait belt Activity Tolerance: Patient limited by pain Patient left: with call bell/phone within reach;in chair;with chair alarm set Nurse Communication: Mobility status PT Visit Diagnosis: Muscle weakness (generalized) (M62.81);Difficulty in walking, not elsewhere classified (R26.2);Pain Pain - Right/Left: Right Pain - part of body: Knee     Time: 5638-9373 PT Time Calculation (min) (ACUTE ONLY): 24 min  Charges:  $Gait Training: 8-22 mins $Therapeutic Exercise: 8-22 mins                     Tamala Ser PT 05/26/2021  Acute Rehabilitation Services Pager [phone] Office [phone]

## 2021-05-26 NOTE — Discharge Instructions (Addendum)
INSTRUCTIONS AFTER JOINT REPLACEMENT   Remove items at home which could result in a fall. This includes throw rugs or furniture in walking pathways ICE to the affected joint every three hours while awake for 30 minutes at a time, for at least the first 3-5 days, and then as needed for pain and swelling.  Continue to use ice for pain and swelling. You may notice swelling that will progress down to the foot and ankle.  This is normal after surgery.  Elevate your leg when you are not up walking on it.   Continue to use the breathing machine you got in the hospital (incentive spirometer) which will help keep your temperature down.  It is common for your temperature to cycle up and down following surgery, especially at night when you are not up moving around and exerting yourself.  The breathing machine keeps your lungs expanded and your temperature down.   DIET:  As you were doing prior to hospitalization, we recommend a well-balanced diet.  DRESSING / WOUND CARE / SHOWERING  Keep the surgical dressing until follow up.  The dressing is water proof, so you can shower without any extra covering.  IF THE DRESSING FALLS OFF or the wound gets wet inside, change the dressing with sterile gauze.  Please use good hand washing techniques before changing the dressing.  Do not use any lotions or creams on the incision until instructed by your surgeon.    ACTIVITY  Increase activity slowly as tolerated, but follow the weight bearing instructions below.   No driving for 6 weeks or until further direction given by your physician.  You cannot drive while taking narcotics.  No lifting or carrying greater than 10 lbs. until further directed by your surgeon. Avoid periods of inactivity such as sitting longer than an hour when not asleep. This helps prevent blood clots.  You may return to work once you are authorized by your doctor.     WEIGHT BEARING   Weight bearing as tolerated with assist device (walker, cane,  etc) as directed, use it as long as suggested by your surgeon or therapist, typically at least 4-6 weeks.   EXERCISES  Results after joint replacement surgery are often greatly improved when you follow the exercise, range of motion and muscle strengthening exercises prescribed by your doctor. Safety measures are also important to protect the joint from further injury. Any time any of these exercises cause you to have increased pain or swelling, decrease what you are doing until you are comfortable again and then slowly increase them. If you have problems or questions, call your caregiver or physical therapist for advice.   Rehabilitation is important following a joint replacement. After just a few days of immobilization, the muscles of the leg can become weakened and shrink (atrophy).  These exercises are designed to build up the tone and strength of the thigh and leg muscles and to improve motion. Often times heat used for twenty to thirty minutes before working out will loosen up your tissues and help with improving the range of motion but do not use heat for the first two weeks following surgery (sometimes heat can increase post-operative swelling).   These exercises can be done on a training (exercise) mat, on the floor, on a table or on a bed. Use whatever works the best and is most comfortable for you.    Use music or television while you are exercising so that the exercises are a pleasant break in your   day. This will make your life better with the exercises acting as a break in your routine that you can look forward to.   Perform all exercises about fifteen times, three times per day or as directed.  You should exercise both the operative leg and the other leg as well.  Exercises include:   Quad Sets - Tighten up the muscle on the front of the thigh (Quad) and hold for 5-10 seconds.   Straight Leg Raises - With your knee straight (if you were given a brace, keep it on), lift the leg to 60  degrees, hold for 3 seconds, and slowly lower the leg.  Perform this exercise against resistance later as your leg gets stronger.  Leg Slides: Lying on your back, slowly slide your foot toward your buttocks, bending your knee up off the floor (only go as far as is comfortable). Then slowly slide your foot back down until your leg is flat on the floor again.  Angel Wings: Lying on your back spread your legs to the side as far apart as you can without causing discomfort.  Hamstring Strength:  Lying on your back, push your heel against the floor with your leg straight by tightening up the muscles of your buttocks.  Repeat, but this time bend your knee to a comfortable angle, and push your heel against the floor.  You may put a pillow under the heel to make it more comfortable if necessary.   A rehabilitation program following joint replacement surgery can speed recovery and prevent re-injury in the future due to weakened muscles. Contact your doctor or a physical therapist for more information on knee rehabilitation.    CONSTIPATION  Constipation is defined medically as fewer than three stools per week and severe constipation as less than one stool per week.  Even if you have a regular bowel pattern at home, your normal regimen is likely to be disrupted due to multiple reasons following surgery.  Combination of anesthesia, postoperative narcotics, change in appetite and fluid intake all can affect your bowels.   YOU MUST use at least one of the following options; they are listed in order of increasing strength to get the job done.  They are all available over the counter, and you may need to use some, POSSIBLY even all of these options:    Drink plenty of fluids (prune juice may be helpful) and high fiber foods Colace 100 mg by mouth twice a day  Senokot for constipation as directed and as needed Dulcolax (bisacodyl), take with full glass of water  Miralax (polyethylene glycol) once or twice a day as  needed.  If you have tried all these things and are unable to have a bowel movement in the first 3-4 days after surgery call either your surgeon or your primary doctor.    If you experience loose stools or diarrhea, hold the medications until you stool forms back up.  If your symptoms do not get better within 1 week or if they get worse, check with your doctor.  If you experience "the worst abdominal pain ever" or develop nausea or vomiting, please contact the office immediately for further recommendations for treatment.   ITCHING:  If you experience itching with your medications, try taking only a single pain pill, or even half a pain pill at a time.  You can also use Benadryl over the counter for itching or also to help with sleep.   TED HOSE STOCKINGS:  Use stockings on both   legs until for at least 2 weeks or as directed by physician office. They may be removed at night for sleeping.  MEDICATIONS:  See your medication summary on the "After Visit Summary" that nursing will review with you.  You may have some home medications which will be placed on hold until you complete the course of blood thinner medication.  It is important for you to complete the blood thinner medication as prescribed.  PRECAUTIONS:  If you experience chest pain or shortness of breath - call 911 immediately for transfer to the hospital emergency department.   If you develop a fever greater that 101 F, purulent drainage from wound, increased redness or drainage from wound, foul odor from the wound/dressing, or calf pain - CONTACT YOUR SURGEON.                                                   FOLLOW-UP APPOINTMENTS:  If you do not already have a post-op appointment, please call the office for an appointment to be seen by your surgeon.  Guidelines for how soon to be seen are listed in your "After Visit Summary", but are typically between 1-4 weeks after surgery.  OTHER INSTRUCTIONS:   Knee Replacement:  Do not place pillow  under knee, focus on keeping the knee straight while resting. CPM instructions: 0-90 degrees, 2 hours in the morning, 2 hours in the afternoon, and 2 hours in the evening. Place foam block, curve side up under heel at all times except when in CPM or when walking.  DO NOT modify, tear, cut, or change the foam block in any way.  POST-OPERATIVE OPIOID TAPER INSTRUCTIONS: It is important to wean off of your opioid medication as soon as possible. If you do not need pain medication after your surgery it is ok to stop day one. Opioids include: Codeine, Hydrocodone(Norco, Vicodin), Oxycodone(Percocet, oxycontin) and hydromorphone amongst others.  Long term and even short term use of opiods can cause: Increased pain response Dependence Constipation Depression Respiratory depression And more.  Withdrawal symptoms can include Flu like symptoms Nausea, vomiting And more Techniques to manage these symptoms Hydrate well Eat regular healthy meals Stay active Use relaxation techniques(deep breathing, meditating, yoga) Do Not substitute Alcohol to help with tapering If you have been on opioids for less than two weeks and do not have pain than it is ok to stop all together.  Plan to wean off of opioids This plan should start within one week post op of your joint replacement. Maintain the same interval or time between taking each dose and first decrease the dose.  Cut the total daily intake of opioids by one tablet each day Next start to increase the time between doses. The last dose that should be eliminated is the evening dose.   MAKE SURE YOU:  Understand these instructions.  Get help right away if you are not doing well or get worse.    Thank you for letting us be a part of your medical care team.  It is a privilege we respect greatly.  We hope these instructions will help you stay on track for a fast and full recovery!   Dental Antibiotics:  In most cases prophylactic antibiotics for Dental  procdeures after total joint surgery are not necessary.  Exceptions are as follows:  1. History of prior total joint   infection  2. Severely immunocompromised (Organ Transplant, cancer chemotherapy, Rheumatoid biologic meds such as Humera)  3. Poorly controlled diabetes (A1C &gt; 8.0, blood glucose over 200)  If you have one of these conditions, contact your surgeon for an antibiotic prescription, prior to your dental procedure.      Information on my medicine - ELIQUIS (apixaban)  This medication education was reviewed with me or my healthcare representative as part of my discharge preparation.   Why was Eliquis prescribed for you? Eliquis was prescribed for you to reduce the risk of blood clots forming after orthopedic surgery.    What do You need to know about Eliquis? Take your Eliquis TWICE DAILY - one tablet in the morning and one tablet in the evening with or without food.  It would be best to take the dose about the same time each day.  If you have difficulty swallowing the tablet whole please discuss with your pharmacist how to take the medication safely.  Take Eliquis exactly as prescribed by your doctor and DO NOT stop taking Eliquis without talking to the doctor who prescribed the medication.  Stopping without other medication to take the place of Eliquis may increase your risk of developing a clot.  After discharge, you should have regular check-up appointments with your healthcare provider that is prescribing your Eliquis.  What do you do if you miss a dose? If a dose of ELIQUIS is not taken at the scheduled time, take it as soon as possible on the same day and twice-daily administration should be resumed.  The dose should not be doubled to make up for a missed dose.  Do not take more than one tablet of ELIQUIS at the same time.  Important Safety Information A possible side effect of Eliquis is bleeding. You should call your healthcare provider right away  if you experience any of the following: Bleeding from an injury or your nose that does not stop. Unusual colored urine (red or dark brown) or unusual colored stools (red or black). Unusual bruising for unknown reasons. A serious fall or if you hit your head (even if there is no bleeding).  Some medicines may interact with Eliquis and might increase your risk of bleeding or clotting while on Eliquis. To help avoid this, consult your healthcare provider or pharmacist prior to using any new prescription or non-prescription medications, including herbals, vitamins, non-steroidal anti-inflammatory drugs (NSAIDs) and supplements.  This website has more information on Eliquis (apixaban): http://www.eliquis.com/eliquis/home  

## 2021-05-26 NOTE — TOC Transition Note (Signed)
Transition of Care [facility]) - CM/SW Discharge Note   Patient Details  Name: Brittany Morris MRN: [id number] Date of Birth: [birth year]  Transition of [facility]) CM/SW Contact:  Golda Acre, RN Phone Number: 05/26/2021, 9:49 AM   Clinical Narrative:    Rolling walker [facility] through [facility]  Final next level of care: Home w Home Health Services Barriers to Discharge: No Barriers Identified   Patient Goals and CMS Choice Patient states their goals for this hospitalization and ongoing recovery are:: to go home CMS Medicare.gov Compare Post Acute Care list provided to:: Patient Choice offered to / list presented to : Patient  Discharge Placement                       Discharge Plan and Services   Discharge Planning Services: CM Consult Post Acute Care Choice: Home Health, Durable Medical Equipment          DME Arranged: Walker rolling DME Agency: [employer] Date DME Agency Contacted: 05/26/21 Time DME Agency Contacted: [phone] Representative spoke with at DME Agency: [facility] HH Arranged: PT HH Agency: [facility] Date [facility] Agency Contacted: 05/26/21 Time HH Agency Contacted: [phone] Representative spoke with at [facility] Agency: stacie  Social Determinants of Health (SDOH) Interventions     Readmission Risk Interventions No flowsheet data found.

## 2021-05-26 NOTE — Progress Notes (Signed)
Dr Ophelia Charter notified of patients current vital signs.  He said to give tylenol and encourage incentive spirometer.

## 2021-05-27 DIAGNOSIS — G8929 Other chronic pain: Secondary | ICD-10-CM | POA: Diagnosis present

## 2021-05-27 DIAGNOSIS — E7800 Pure hypercholesterolemia, unspecified: Secondary | ICD-10-CM | POA: Diagnosis present

## 2021-05-27 DIAGNOSIS — M25761 Osteophyte, right knee: Secondary | ICD-10-CM | POA: Diagnosis present

## 2021-05-27 DIAGNOSIS — Z8616 Personal history of COVID-19: Secondary | ICD-10-CM | POA: Diagnosis not present

## 2021-05-27 DIAGNOSIS — Z9049 Acquired absence of other specified parts of digestive tract: Secondary | ICD-10-CM | POA: Diagnosis not present

## 2021-05-27 DIAGNOSIS — Z87891 Personal history of nicotine dependence: Secondary | ICD-10-CM | POA: Diagnosis not present

## 2021-05-27 DIAGNOSIS — M25461 Effusion, right knee: Secondary | ICD-10-CM | POA: Diagnosis present

## 2021-05-27 DIAGNOSIS — Z90710 Acquired absence of both cervix and uterus: Secondary | ICD-10-CM | POA: Diagnosis not present

## 2021-05-27 DIAGNOSIS — Z883 Allergy status to other anti-infective agents status: Secondary | ICD-10-CM | POA: Diagnosis not present

## 2021-05-27 DIAGNOSIS — E119 Type 2 diabetes mellitus without complications: Secondary | ICD-10-CM | POA: Diagnosis present

## 2021-05-27 DIAGNOSIS — D869 Sarcoidosis, unspecified: Secondary | ICD-10-CM | POA: Diagnosis present

## 2021-05-27 DIAGNOSIS — M1711 Unilateral primary osteoarthritis, right knee: Secondary | ICD-10-CM | POA: Diagnosis present

## 2021-05-27 DIAGNOSIS — Z881 Allergy status to other antibiotic agents status: Secondary | ICD-10-CM | POA: Diagnosis not present

## 2021-05-27 DIAGNOSIS — Z20822 Contact with and (suspected) exposure to covid-19: Secondary | ICD-10-CM | POA: Diagnosis present

## 2021-05-27 DIAGNOSIS — M65861 Other synovitis and tenosynovitis, right lower leg: Secondary | ICD-10-CM | POA: Diagnosis present

## 2021-05-27 DIAGNOSIS — Z96651 Presence of right artificial knee joint: Secondary | ICD-10-CM

## 2021-05-27 DIAGNOSIS — I10 Essential (primary) hypertension: Secondary | ICD-10-CM | POA: Diagnosis present

## 2021-05-27 LAB — GLUCOSE, CAPILLARY
Glucose-Capillary: 137 mg/dL — ABNORMAL HIGH (ref 70–99)
Glucose-Capillary: 125 mg/dL — ABNORMAL HIGH (ref 70–99)
Glucose-Capillary: 131 mg/dL — ABNORMAL HIGH (ref 70–99)
Glucose-Capillary: 119 mg/dL — ABNORMAL HIGH (ref 70–99)

## 2021-05-27 MED ORDER — APIXABAN 2.5 MG PO TABS
2.5000 mg | ORAL_TABLET | Freq: Two times a day (BID) | ORAL | Status: DC
  Administered 2021-05-27 – 2021-06-02 (×13): 2.5 mg via ORAL
  Filled 2021-05-27 (×13): qty 1

## 2021-05-27 MED ORDER — HYDROMORPHONE HCL 4 MG PO TABS: 4.0000 mg | ORAL_TABLET | ORAL | 0 refills | Status: DC | PRN

## 2021-05-27 MED ORDER — KETOROLAC TROMETHAMINE 15 MG/ML IJ SOLN
15.0000 mg | Freq: Four times a day (QID) | INTRAMUSCULAR | Status: AC
  Administered 2021-05-27 (×2): 15 mg via INTRAVENOUS
  Filled 2021-05-27 (×2): qty 1

## 2021-05-27 NOTE — Progress Notes (Signed)
   05/26/21 2058  Assess: MEWS Score  Temp 98.8 F (37.1 C)  BP (!) 148/81  Pulse Rate (!) 126  Resp 16  Level of Consciousness Alert  SpO2 94 %  O2 Device Nasal Cannula  O2 Flow Rate (L/min) 2 L/min  Assess: MEWS Score  MEWS Temp 0  MEWS Systolic 0  MEWS Pulse 2  MEWS RR 0  MEWS LOC 0  MEWS Score 2  MEWS Score Color Yellow  Assess: if the MEWS score is Yellow or Red  Were vital signs taken at a resting state? Yes  Focused Assessment No change from prior assessment  Does the patient meet 2 or more of the SIRS criteria? No  MEWS guidelines implemented *See Row Information* No, previously yellow, continue vital signs every 4 hours  Treat  Pain Scale 0-10  Pain Score 4  Pain Type Surgical pain  Pain Location Knee  Pain Orientation Right  Pain Descriptors / Indicators Discomfort  Pain Frequency Constant  Pain Onset On-going  Patients Stated Pain Goal 2  Pain Intervention(s) Cold applied;Emotional support  Multiple Pain Sites No  Assess: SIRS CRITERIA  SIRS Temperature  0  SIRS Pulse 1  SIRS Respirations  0  SIRS WBC 0  SIRS Score Sum  1  There was no change from prior assessment. Charge nurse and attending aware of yellow MEWS. Will continue to monitor patient.

## 2021-05-27 NOTE — Plan of Care (Signed)
  Problem: Clinical Measurements: Goal: Will remain free from infection Outcome: Progressing   Problem: Clinical Measurements: Goal: Respiratory complications will improve Outcome: Progressing   Problem: Clinical Measurements: Goal: Cardiovascular complication will be avoided Outcome: Progressing   Problem: Elimination: Goal: Will not experience complications related to bowel motility Outcome: Progressing   Problem: Pain Managment: Goal: General experience of comfort will improve Outcome: Progressing

## 2021-05-27 NOTE — Progress Notes (Signed)
Physical Therapy Treatment Patient Details Name: Brittany Morris MRN: [id number] DOB: 11-15-1965 Today's Date: 05/27/2021   History of Present Illness Patient is 55 y.o. female s/p Rt TKA on 05/25/21 with PMH significant for OA, DM, HTN, sarcoidosis, back surgery.    PT Comments    Bed level ROM exercises only this am. Pt declined OOB. Pt reports severe pain, even at rest. Will attempt a 2nd session later today if pt is agreeable.     Recommendations for follow up therapy are one component of a multi-disciplinary discharge planning process, led by the attending physician.  Recommendations may be updated based on patient status, additional functional criteria and insurance authorization.  Follow Up Recommendations  Follow surgeon's recommendation for DC plan and follow-up therapies     Equipment Recommendations  Rolling walker with 5" wheels    Recommendations for Other Services       Precautions / Restrictions Precautions Precautions: Knee Precaution Comments: reviewed no pillow under knee Restrictions Weight Bearing Restrictions: No RLE Weight Bearing: Weight bearing as tolerated     Mobility  Bed Mobility               General bed mobility comments: pt declined OOB with PT this a.m    Transfers                    Ambulation/Gait                 Stairs             Wheelchair Mobility    Modified Rankin (Stroke Patients Only)       Balance                                            Cognition Arousal/Alertness: Awake/alert Behavior During Therapy: WFL for tasks assessed/performed Overall Cognitive Status: Within Functional Limits for tasks assessed                                        Exercises Total Joint Exercises Ankle Circles/Pumps: AROM;Both;5 reps Quad Sets: AROM;Right;5 reps Heel Slides: AAROM;Right;5 reps Hip ABduction/ADduction: AAROM;Right;5 reps Straight Leg Raises:  AAROM;Right;5 reps Goniometric ROM: ~10-30 degrees (limited by pain)    General Comments        Pertinent Vitals/Pain Pain Assessment: 0-10 Pain Score: 10-Worst pain ever Pain Location: Rt knee and R groin Pain Descriptors / Indicators: Aching;Discomfort;Sore Pain Intervention(s): Limited activity within patient's tolerance;Monitored during session;Ice applied    Home Living                      Prior Function            PT Goals (current goals can now be found in the care plan section) Progress towards PT goals: Progressing toward goals    Frequency    7X/week      PT Plan Current plan remains appropriate    Co-evaluation              AM-PAC PT "6 Clicks" Mobility   Outcome Measure  Help needed turning from your back to your side while in a flat bed without using bedrails?: A Little Help needed moving from lying on your back to sitting on the side of a  flat bed without using bedrails?: A Lot Help needed moving to and from a bed to a chair (including a wheelchair)?: A Lot Help needed standing up from a chair using your arms (e.g., wheelchair or bedside chair)?: A Lot Help needed to walk in hospital room?: A Lot Help needed climbing 3-5 steps with a railing? : Total 6 Click Score: 12    End of Session   Activity Tolerance: Patient limited by pain Patient left: in bed;with call bell/phone within reach   PT Visit Diagnosis: Muscle weakness (generalized) (M62.81);Difficulty in walking, not elsewhere classified (R26.2);Pain Pain - Right/Left: Right Pain - part of body: Knee     Time: 8099-8338 PT Time Calculation (min) (ACUTE ONLY): 8 min  Charges:  $Therapeutic Exercise: 8-22 mins                         Faye Ramsay, PT Acute Rehabilitation  Office: [phone] Pager: [phone]

## 2021-05-27 NOTE — Progress Notes (Signed)
Patient ID: Brittany Morris, female   DOB: [birth year], 55 y.o.   MRN: [id number] The patient is postop day 2 status post a right total knee arthroplasty to treat her severe right knee osteoarthritis.  She is struggling with pain control.  Prior to surgery she is in pain management and does have a morphine pump as well.  This does heighten her pain centers and makes pain control after surgery such as this quite difficult.  This is significant limiting her mobility.  At this point anticipate her being here through the weekend.  I will change her to an inpatient admission.  I will also likely put her on a stronger blood thinning medication due to her limited mobility which leads to a heightened blood clot risk.  I did change her knee dressing for the right knee and her incision looks good.  I encouraged her mobility.

## 2021-05-27 NOTE — Progress Notes (Signed)
PT Cancellation Note  Patient Details Name: Brittany Morris MRN: [id number] DOB: 03/18/1966   Cancelled Treatment:    Reason Eval/Treat Not Completed:  Attempted PT tx session-pt declined to participate with PT this afternoon. Will check back on tomorrow.    Faye Ramsay, PT Acute Rehabilitation  Office: [phone] Pager: [phone]

## 2021-05-28 LAB — GLUCOSE, CAPILLARY
Glucose-Capillary: 115 mg/dL — ABNORMAL HIGH (ref 70–99)
Glucose-Capillary: 205 mg/dL — ABNORMAL HIGH (ref 70–99)
Glucose-Capillary: 161 mg/dL — ABNORMAL HIGH (ref 70–99)
Glucose-Capillary: 216 mg/dL — ABNORMAL HIGH (ref 70–99)

## 2021-05-28 NOTE — Plan of Care (Signed)

## 2021-05-28 NOTE — Progress Notes (Signed)
Physical Therapy Treatment Patient Details Name: Brittany Morris MRN: [id number] DOB: 1966/06/10 Today's Date: 05/28/2021   History of Present Illness Patient is 55 y.o. female s/p Rt TKA on 05/25/21 with PMH significant for OA, DM, HTN, sarcoidosis, back surgery.    PT Comments    Daughters present during session this afternoon-motivating pt. Pt appears more shaky/exhibiting tremors. Family reports this has been something pt has been putting off getting checked out. It seems more pronounced this afternoon compared to this morning. Near fall after taking first step forwards-pt assisted back onto bed. After rest, pt was able to stand and pivot to bsc. Total assist for hygiene. Pt then walked ~3 feet over to recliner. She tolerated ROM exercises on today. Will continue to follow and progress activity as tolerated. Anticipate pt will require several more days in hospital to work with therapy before going home.     Recommendations for follow up therapy are one component of a multi-disciplinary discharge planning process, led by the attending physician.  Recommendations may be updated based on patient status, additional functional criteria and insurance authorization.  Follow Up Recommendations  Follow surgeon's recommendation for DC plan and follow-up therapies     Equipment Recommendations  Rolling walker with 5" wheels    Recommendations for Other Services       Precautions / Restrictions Precautions Precautions: Knee Precaution Comments: reviewed no pillow under knee Restrictions Weight Bearing Restrictions: No RLE Weight Bearing: Weight bearing as tolerated     Mobility  Bed Mobility Overal bed mobility: Needs Assistance Bed Mobility: Supine to Sit     Supine to sit: Min assist     General bed mobility comments: Assist for R LE. Increased time.    Transfers Overall transfer level: Needs assistance Equipment used: Rolling walker (2 wheeled) Transfers: Sit to/from  Stand Sit to Stand: Min assist Stand pivot transfers: Min assist       General transfer comment: Assist to rise, steady, control descent. Cues for safety, technique, hand/LE placement. Increased time. Near fall with assistance back onto bed. Pt stated she felt like her knee buckled. After brief rest, she was able to stand again and pivot over to bsc  Ambulation/Gait Ambulation/Gait assistance: Min assist Gait Distance (Feet): 3 Feet Assistive device: Rolling walker (2 wheeled)       General Gait Details: Cues for safety, technique, sequence, proper use of RW, and for pt to gradually try to put weight though R LE. Slow gait speed. Assist to steady throughout. She took a few steps over to the recliner. Then had pt stand statically for another 30 seconds or so until she fatigued/requested to sit down.   Stairs             Wheelchair Mobility    Modified Rankin (Stroke Patients Only)       Balance Overall balance assessment: Needs assistance         Standing balance support: Bilateral upper extremity supported Standing balance-Leahy Scale: Poor                              Cognition Arousal/Alertness: Awake/alert Behavior During Therapy: WFL for tasks assessed/performed Overall Cognitive Status: Within Functional Limits for tasks assessed                                        Exercises  Total Joint Exercises Ankle Circles/Pumps: AROM;Both;10 reps Quad Sets: AROM;Both;10 reps Heel Slides: AAROM;Right;10 reps Hip ABduction/ADduction: AAROM;AROM;Right;10 reps Straight Leg Raises: AROM;AAROM;Right;10 reps Goniometric ROM: ~10-60 degrees    General Comments        Pertinent Vitals/Pain Pain Assessment: Faces Faces Pain Scale: Hurts whole lot Pain Location: Rt knee and R groin Pain Descriptors / Indicators: Aching;Discomfort;Sore Pain Intervention(s): Limited activity within patient's tolerance;Monitored during session;Ice  applied;Repositioned    Home Living                      Prior Function            PT Goals (current goals can now be found in the care plan section) Progress towards PT goals: Progressing toward goals    Frequency    7X/week      PT Plan Current plan remains appropriate    Co-evaluation              AM-PAC PT "6 Clicks" Mobility   Outcome Measure  Help needed turning from your back to your side while in a flat bed without using bedrails?: A Little Help needed moving from lying on your back to sitting on the side of a flat bed without using bedrails?: A Little Help needed moving to and from a bed to a chair (including a wheelchair)?: A Little Help needed standing up from a chair using your arms (e.g., wheelchair or bedside chair)?: A Little Help needed to walk in hospital room?: A Lot Help needed climbing 3-5 steps with a railing? : A Lot 6 Click Score: 16    End of Session Equipment Utilized During Treatment: Gait belt Activity Tolerance: Patient limited by pain;Patient limited by fatigue Patient left: in chair;with call bell/phone within reach;with family/visitor present   PT Visit Diagnosis: Muscle weakness (generalized) (M62.81);Difficulty in walking, not elsewhere classified (R26.2);Pain Pain - Right/Left: Right Pain - part of body: Knee     Time: 4944-9675 PT Time Calculation (min) (ACUTE ONLY): 36 min  Charges:  $Gait Training: 23-37 mins                        Faye Ramsay, PT Acute Rehabilitation  Office: [phone] Pager: [phone]

## 2021-05-28 NOTE — Progress Notes (Signed)
Physical Therapy Treatment Patient Details Name: Brittany Morris MRN: [id number] DOB: [birth year] Today's Date: 05/28/2021   History of Present Illness Patient is 55 y.o. female s/p Rt TKA on 05/25/21 with PMH significant for OA, DM, HTN, sarcoidosis, back surgery.    PT Comments    Progressing slowly. Pain seems to be more controlled on today. Mod encouragement for progression of activity. Pt walked ~7 feet x 2 this session. Will continue to follow and progress activity as able.     Recommendations for follow up therapy are one component of a multi-disciplinary discharge planning process, led by the attending physician.  Recommendations may be updated based on patient status, additional functional criteria and insurance authorization.  Follow Up Recommendations  Follow surgeon's recommendation for DC plan and follow-up therapies     Equipment Recommendations  Rolling walker with 5" wheels    Recommendations for Other Services       Precautions / Restrictions Precautions Precautions: Knee Precaution Comments: reviewed no pillow under knee Restrictions Weight Bearing Restrictions: No RLE Weight Bearing: Weight bearing as tolerated     Mobility  Bed Mobility Overal bed mobility: Needs Assistance Bed Mobility: Supine to Sit;Sit to Supine     Supine to sit: Min assist Sit to supine: Min assist   General bed mobility comments: Assist for R LE. Increased time. Some dizziness but pt able to stand    Transfers Overall transfer level: Needs assistance Equipment used: Rolling walker (2 wheeled) Transfers: Sit to/from Stand Sit to Stand: Min assist;From elevated surface         General transfer comment: Assist to rise, steady, control descent. Cues for safety, technique, hand/LE placement. Increased time. Some dizziness but pt able to proceed with ambulation.  Ambulation/Gait Ambulation/Gait assistance: Min assist Gait Distance (Feet): 7 Feet (x2) Assistive device:  Rolling walker (2 wheeled) Gait Pattern/deviations: Step-to pattern;Antalgic     General Gait Details: Cues for safety, technique, sequence, proper use of RW, and for pt to gradually try to put weight though R LE. Slow gait speed. Assist to steady throughout. Pt did better with weightbearing on walk back to bed from bsc.   Stairs             Wheelchair Mobility    Modified Rankin (Stroke Patients Only)       Balance Overall balance assessment: Needs assistance         Standing balance support: Bilateral upper extremity supported Standing balance-Leahy Scale: Poor                              Cognition Arousal/Alertness: Awake/alert Behavior During Therapy: WFL for tasks assessed/performed Overall Cognitive Status: Within Functional Limits for tasks assessed                                 General Comments: drowsy      Exercises      General Comments        Pertinent Vitals/Pain Pain Assessment: Faces Faces Pain Scale: Hurts whole lot Pain Location: Rt knee and R groin Pain Descriptors / Indicators: Aching;Discomfort;Sore Pain Intervention(s): Limited activity within patient's tolerance;Monitored during session;Ice applied;Repositioned    Home Living                      Prior Function            PT  Goals (current goals can now be found in the care plan section) Progress towards PT goals: Progressing toward goals    Frequency    7X/week      PT Plan Current plan remains appropriate    Co-evaluation              AM-PAC PT "6 Clicks" Mobility   Outcome Measure  Help needed turning from your back to your side while in a flat bed without using bedrails?: A Little Help needed moving from lying on your back to sitting on the side of a flat bed without using bedrails?: A Little Help needed moving to and from a bed to a chair (including a wheelchair)?: A Little Help needed standing up from a chair using  your arms (e.g., wheelchair or bedside chair)?: A Little Help needed to walk in hospital room?: A Little Help needed climbing 3-5 steps with a railing? : A Lot 6 Click Score: 17    End of Session Equipment Utilized During Treatment: Gait belt Activity Tolerance: Patient limited by pain Patient left: in bed;with call bell/phone within reach;with bed alarm set   PT Visit Diagnosis: Muscle weakness (generalized) (M62.81);Difficulty in walking, not elsewhere classified (R26.2);Pain Pain - Right/Left: Right Pain - part of body: Knee     Time: 5885-0277 PT Time Calculation (min) (ACUTE ONLY): 22 min  Charges:  $Gait Training: 8-22 mins                         Faye Ramsay, PT Acute Rehabilitation  Office: [phone] Pager: [phone]

## 2021-05-28 NOTE — Progress Notes (Signed)
   Subjective: 3 Days Post-Op Procedure(s) (LRB): RIGHT TOTAL KNEE ARTHROPLASTY (Right) Patient reports pain as severe.  Better after toradol doses. Ate breakfast , first meal since in hospital.   Objective: Vital signs in last 24 hours: Temp:  [97.8 F (36.6 C)-98.4 F (36.9 C)] 97.8 F (36.6 C) (10/09 0519) Pulse Rate:  [87-110] 103 (10/09 0519) Resp:  [16-18] 16 (10/09 0519) BP: (124-147)/(70-83) 124/74 (10/09 0519) SpO2:  [94 %-98 %] 96 % (10/09 0519)  Intake/Output from previous day: 10/08 0701 - 10/09 0700 In: 2860.4 [P.O.:2280; I.V.:580.4] Out: -  Intake/Output this shift: Total I/O In: 240 [P.O.:240] Out: -   Recent Labs    05/26/21 0334  HGB 13.0   Recent Labs    05/26/21 0334  WBC 7.8  RBC 4.38  HCT 41.8  PLT 152   Recent Labs    05/26/21 0334  NA 137  K 3.9  CL 103  CO2 26  BUN 12  CREATININE 0.76  GLUCOSE 161*  CALCIUM 8.2*   No results for input(s): LABPT, INR in the last 72 hours.  Neurologically intact No results found.  Assessment/Plan: 3 Days Post-Op Procedure(s) (LRB): RIGHT TOTAL KNEE ARTHROPLASTY (Right) Up with therapy, pain has been problem, moving slowly , hopefully better progress today as pain has improved from yesterday.   Brittany Morris 05/28/2021, 8:21 AM

## 2021-05-28 NOTE — Plan of Care (Signed)
  Problem: Clinical Measurements: Goal: Diagnostic test results will improve Outcome: Progressing   Problem: Clinical Measurements: Goal: Respiratory complications will improve Outcome: Progressing   Problem: Clinical Measurements: Goal: Cardiovascular complication will be avoided Outcome: Progressing   Problem: Elimination: Goal: Will not experience complications related to bowel motility Outcome: Progressing   Problem: Elimination: Goal: Will not experience complications related to urinary retention Outcome: Progressing   

## 2021-05-29 ENCOUNTER — Encounter (HOSPITAL_COMMUNITY): Payer: Self-pay | Admitting: Orthopaedic Surgery

## 2021-05-29 LAB — GLUCOSE, CAPILLARY
Glucose-Capillary: 96 mg/dL (ref 70–99)
Glucose-Capillary: 199 mg/dL — ABNORMAL HIGH (ref 70–99)
Glucose-Capillary: 162 mg/dL — ABNORMAL HIGH (ref 70–99)

## 2021-05-29 NOTE — Progress Notes (Signed)
Physical Therapy Treatment Patient Details Name: Brittany Morris MRN: [id number] DOB: [birth year] Today's Date: 05/29/2021   History of Present Illness Patient is 55 y.o. female s/p Rt TKA on 05/25/21 with PMH significant for OA, DM, HTN, sarcoidosis, back surgery.    PT Comments    Pt walked ~35 feet this afternoon. She remains a fall risk, especially with muscle jerking that is occurring. Moderate pain with activity. Daughter was present and assisted with following with recliner. Will continue to progress activity as safely able.    Recommendations for follow up therapy are one component of a multi-disciplinary discharge planning process, led by the attending physician.  Recommendations may be updated based on patient status, additional functional criteria and insurance authorization.  Follow Up Recommendations  Follow surgeon's recommendation for DC plan and follow-up therapies     Equipment Recommendations  Rolling walker with 5" wheels    Recommendations for Other Services       Precautions / Restrictions Precautions Precautions: Fall;Knee Precaution Comments: reviewed no pillow under knee Restrictions Weight Bearing Restrictions: No RLE Weight Bearing: Weight bearing as tolerated     Mobility  Bed Mobility Overal bed mobility: Needs Assistance Bed Mobility: Supine to Sit;Sit to Supine     Supine to sit: Min assist;HOB elevated Sit to supine: Min assist;HOB elevated   General bed mobility comments: Assist for R LE. Increased time. Extended time spent EOB 2* muscle jerking observed (L hand fall off walker)    Transfers Overall transfer level: Needs assistance Equipment used: Rolling walker (2 wheeled) Transfers: Sit to/from Stand Sit to Stand: Min assist;From elevated surface         General transfer comment: Assist to rise, steady, control descent. Cues for safety, technique, hand/LE placement. Increased time. Stood statically and weightshifted for a bit  before leaving bedside  Ambulation/Gait Ambulation/Gait assistance: Min assist;+2 safety/equipment Gait Distance (Feet): 35 Feet Assistive device: Rolling walker (2 wheeled) Gait Pattern/deviations: Step-to pattern     General Gait Details: Cues for safety, technique, sequence, proper use of RW, and for pt to gradually try to put weight though R LE. Slow gait speed. Assist to steady throughout. Followed with recliner but did not need it this afternoon   Stairs             Wheelchair Mobility    Modified Rankin (Stroke Patients Only)       Balance Overall balance assessment: Needs assistance         Standing balance support: Bilateral upper extremity supported Standing balance-Leahy Scale: Poor                              Cognition Arousal/Alertness: Awake/alert Behavior During Therapy: WFL for tasks assessed/performed Overall Cognitive Status: Within Functional Limits for tasks assessed                                 General Comments: drowsy at times      Exercises      General Comments        Pertinent Vitals/Pain Pain Assessment: 0-10 Pain Score: 7  Pain Location: R knee/LE Pain Descriptors / Indicators: Sharp;Discomfort;Sore;Guarding;Grimacing Pain Intervention(s): Limited activity within patient's tolerance;Monitored during session;Ice applied;Repositioned    Home Living                      Prior Function  PT Goals (current goals can now be found in the care plan section) Progress towards PT goals: Progressing toward goals    Frequency    7X/week      PT Plan Current plan remains appropriate    Co-evaluation              AM-PAC PT "6 Clicks" Mobility   Outcome Measure  Help needed turning from your back to your side while in a flat bed without using bedrails?: A Little Help needed moving from lying on your back to sitting on the side of a flat bed without using bedrails?: A  Little Help needed moving to and from a bed to a chair (including a wheelchair)?: A Little Help needed standing up from a chair using your arms (e.g., wheelchair or bedside chair)?: A Little Help needed to walk in hospital room?: A Lot Help needed climbing 3-5 steps with a railing? : A Lot 6 Click Score: 16    End of Session Equipment Utilized During Treatment: Gait belt;Right knee immobilizer Activity Tolerance: Patient limited by pain;Patient limited by fatigue Patient left: with call bell/phone within reach;with bed alarm set   PT Visit Diagnosis: Muscle weakness (generalized) (M62.81);Difficulty in walking, not elsewhere classified (R26.2);Pain Pain - Right/Left: Right Pain - part of body: Knee     Time: 1440-1517 PT Time Calculation (min) (ACUTE ONLY): 37 min  Charges:  $Gait Training: 23-37 mins                         Faye Ramsay, PT Acute Rehabilitation  Office: [phone] Pager: [phone]

## 2021-05-29 NOTE — Progress Notes (Signed)
Physical Therapy Treatment Patient Details Name: Brittany Morris MRN: [id number] DOB: 05-Jun-1966 Today's Date: 05/29/2021   History of Present Illness Patient is 55 y.o. female s/p Rt TKA on 05/25/21 with PMH significant for OA, DM, HTN, sarcoidosis, back surgery.    PT Comments    Progressing slowly. Actually made it out into the hallway on today!. Will continue to progress activity as tolerated/safely able.     Recommendations for follow up therapy are one component of a multi-disciplinary discharge planning process, led by the attending physician.  Recommendations may be updated based on patient status, additional functional criteria and insurance authorization.  Follow Up Recommendations  Follow surgeon's recommendation for DC plan and follow-up therapies; 24 hour supervision/assist     Equipment Recommendations  Rolling walker with 5" wheels    Recommendations for Other Services       Precautions / Restrictions Precautions Precautions: Fall;Knee Precaution Comments: reviewed no pillow under knee Restrictions Weight Bearing Restrictions: No RLE Weight Bearing: Weight bearing as tolerated     Mobility  Bed Mobility Overal bed mobility: Needs Assistance Bed Mobility: Supine to Sit     Supine to sit: Min assist;HOB elevated     General bed mobility comments: Assist for R LE. Increased time.    Transfers Overall transfer level: Needs assistance Equipment used: Rolling walker (2 wheeled) Transfers: Sit to/from Stand Sit to Stand: Min assist         General transfer comment: Assist to rise, steady, control descent. Cues for safety, technique, hand/LE placement. Increased time.  Ambulation/Gait Ambulation/Gait assistance: Min assist;+2 safety/equipment Gait Distance (Feet): 17 Feet Assistive device: Rolling walker (2 wheeled) Gait Pattern/deviations: Step-to pattern;Antalgic     General Gait Details: Cues for safety, technique, sequence, proper use of RW,  and for pt to gradually try to put weight though R LE. Slow gait speed. Assist to steady throughout. Followed with recliner and use it to transport her back to room.   Stairs             Wheelchair Mobility    Modified Rankin (Stroke Patients Only)       Balance Overall balance assessment: Needs assistance         Standing balance support: Bilateral upper extremity supported Standing balance-Leahy Scale: Poor                              Cognition Arousal/Alertness: Awake/alert Behavior During Therapy: WFL for tasks assessed/performed Overall Cognitive Status: Within Functional Limits for tasks assessed                                 General Comments: drowsy      Exercises Total Joint Exercises Ankle Circles/Pumps: AROM;Both;10 reps Quad Sets: AROM;Both;10 reps Heel Slides: AAROM;Right;10 reps Hip ABduction/ADduction: AAROM;AROM;Right;10 reps Straight Leg Raises: AROM;AAROM;Right;10 reps Goniometric ROM: ~10-50 (limited by pain)    General Comments        Pertinent Vitals/Pain Pain Assessment: Faces Faces Pain Scale: Hurts even more Pain Location: Rt knee and R groin Pain Descriptors / Indicators: Aching;Discomfort;Sore Pain Intervention(s): Limited activity within patient's tolerance;Monitored during session;Ice applied;Repositioned    Home Living                      Prior Function            PT Goals (current goals can now  be found in the care plan section) Progress towards PT goals: Progressing toward goals    Frequency    7X/week      PT Plan Current plan remains appropriate    Co-evaluation              AM-PAC PT "6 Clicks" Mobility   Outcome Measure  Help needed turning from your back to your side while in a flat bed without using bedrails?: A Little Help needed moving from lying on your back to sitting on the side of a flat bed without using bedrails?: A Little Help needed moving to  and from a bed to a chair (including a wheelchair)?: A Little Help needed standing up from a chair using your arms (e.g., wheelchair or bedside chair)?: A Little Help needed to walk in hospital room?: A Lot Help needed climbing 3-5 steps with a railing? : A Lot 6 Click Score: 16    End of Session Equipment Utilized During Treatment: Gait belt;Right knee immobilizer Activity Tolerance: Patient limited by pain;Patient limited by fatigue Patient left: in chair;with call bell/phone within reach;with family/visitor present;with chair alarm set   PT Visit Diagnosis: Muscle weakness (generalized) (M62.81);Difficulty in walking, not elsewhere classified (R26.2);Pain Pain - Right/Left: Right Pain - part of body: Knee     Time: 4854-6270 PT Time Calculation (min) (ACUTE ONLY): 25 min  Charges:  $Gait Training: 8-22 mins $Therapeutic Exercise: 8-22 mins                        Faye Ramsay, PT Acute Rehabilitation  Office: [phone] Pager: [phone]

## 2021-05-29 NOTE — Progress Notes (Signed)
Subjective: 4 Days Post-Op Procedure(s) (LRB): RIGHT TOTAL KNEE ARTHROPLASTY (Right) Patient reports pain as moderate to severe. Slow progress with PT walked 17 feet today. Has 5-6 steps to at home.     Objective: Vital signs in last 24 hours: Temp:  [97.6 F (36.4 C)-98.9 F (37.2 C)] 97.6 F (36.4 C) (10/10 0406) Pulse Rate:  [84-99] 84 (10/10 0406) Resp:  [14-17] 17 (10/10 0406) BP: (128-130)/(76-77) 128/76 (10/10 0406) SpO2:  [92 %-95 %] 94 % (10/10 0406)  Intake/Output from previous day: 10/09 0701 - 10/10 0700 In: 1800 [P.O.:1800] Out: -  Intake/Output this shift: Total I/O In: 120 [P.O.:120] Out: -   No results for input(s): HGB in the last 72 hours. No results for input(s): WBC, RBC, HCT, PLT in the last 72 hours. No results for input(s): NA, K, CL, CO2, BUN, CREATININE, GLUCOSE, CALCIUM in the last 72 hours. No results for input(s): LABPT, INR in the last 72 hours.  Intact pulses distally Dorsiflexion/Plantar flexion intact Incision: scant drainage Compartment soft   Assessment/Plan: 4 Days Post-Op Procedure(s) (LRB): RIGHT TOTAL KNEE ARTHROPLASTY (Right) Up with therapy Possible discharge to home tomorrow.      Carroll Lingelbach 05/29/2021, 11:34 AM

## 2021-05-30 ENCOUNTER — Encounter: Payer: Self-pay | Admitting: Orthopaedic Surgery

## 2021-05-30 LAB — GLUCOSE, CAPILLARY
Glucose-Capillary: 188 mg/dL — ABNORMAL HIGH (ref 70–99)
Glucose-Capillary: 94 mg/dL (ref 70–99)
Glucose-Capillary: 176 mg/dL — ABNORMAL HIGH (ref 70–99)
Glucose-Capillary: 177 mg/dL — ABNORMAL HIGH (ref 70–99)
Glucose-Capillary: 149 mg/dL — ABNORMAL HIGH (ref 70–99)

## 2021-05-30 NOTE — Progress Notes (Signed)
Physical Therapy Treatment Patient Details Name: Brittany Morris MRN: [id number] DOB: [birth year] Today's Date: 05/30/2021   History of Present Illness Patient is 55 y.o. female s/p Rt TKA on 05/25/21 with PMH significant for OA, DM, HTN, sarcoidosis, back surgery.    PT Comments    Encouragement required to get pt to ambulate into hallway this a.m. Discussed with pt that she needs to try to push herself to progress her mobility. Will continue to follow and progress activity as able.     Recommendations for follow up therapy are one component of a multi-disciplinary discharge planning process, led by the attending physician.  Recommendations may be updated based on patient status, additional functional criteria and insurance authorization.  Follow Up Recommendations  Follow surgeon's recommendation for DC plan and follow-up therapies     Equipment Recommendations  Rolling walker with 5" wheels    Recommendations for Other Services       Precautions / Restrictions Precautions Precautions: Fall;Knee Precaution Comments: reviewed no pillow under knee Restrictions Weight Bearing Restrictions: No RLE Weight Bearing: Weight bearing as tolerated     Mobility  Bed Mobility Overal bed mobility: Needs Assistance Bed Mobility: Supine to Sit     Supine to sit: HOB elevated;Min assist     General bed mobility comments: Assist for R LE. Increased time.    Transfers Overall transfer level: Needs assistance Equipment used: Rolling walker (2 wheeled) Transfers: Sit to/from Stand Sit to Stand: Min assist Stand pivot transfers: Min assist       General transfer comment: Assist to rise, steady, control descent. Cues for safety, technique, hand/LE placement. Increased time.  Ambulation/Gait Ambulation/Gait assistance: Min assist;+2 safety/equipment Gait Distance (Feet): 15 Feet Assistive device: Rolling walker (2 wheeled) Gait Pattern/deviations: Step-to pattern     General  Gait Details: Cues for safety, technique, sequence, proper use of RW, and for pt to gradually try to put weight though R LE. Slow gait speed. Assist to steady throughout. Followed with recliner-pt requested to sit due to dizziness. Transported back to room with recliner   Stairs             Wheelchair Mobility    Modified Rankin (Stroke Patients Only)       Balance Overall balance assessment: Needs assistance         Standing balance support: Bilateral upper extremity supported Standing balance-Leahy Scale: Poor                              Cognition Arousal/Alertness: Awake/alert Behavior During Therapy: WFL for tasks assessed/performed Overall Cognitive Status: Within Functional Limits for tasks assessed                                 General Comments: drowsy at times      Exercises Total Joint Exercises Ankle Circles/Pumps: AROM;Both;10 reps Quad Sets: AROM;Both;10 reps Short Arc Quad: AROM;Right;10 reps;Supine Heel Slides: AAROM;Right;10 reps Hip ABduction/ADduction: AAROM;AROM;Right;10 reps Goniometric ROM: ~10-50 degrees (limited by pain)    General Comments        Pertinent Vitals/Pain Pain Assessment: 0-10 Pain Score: 7  Pain Location: R knee/LE Pain Descriptors / Indicators: Sharp;Discomfort;Sore;Guarding;Grimacing Pain Intervention(s): Limited activity within patient's tolerance;Monitored during session;Ice applied;Repositioned    Home Living                      Prior Function  PT Goals (current goals can now be found in the care plan section) Progress towards PT goals: Progressing toward goals    Frequency    7X/week      PT Plan Current plan remains appropriate    Co-evaluation              AM-PAC PT "6 Clicks" Mobility   Outcome Measure  Help needed turning from your back to your side while in a flat bed without using bedrails?: A Little Help needed moving from lying on  your back to sitting on the side of a flat bed without using bedrails?: A Little Help needed moving to and from a bed to a chair (including a wheelchair)?: A Little Help needed standing up from a chair using your arms (e.g., wheelchair or bedside chair)?: A Little Help needed to walk in hospital room?: A Lot Help needed climbing 3-5 steps with a railing? : A Lot 6 Click Score: 16    End of Session Equipment Utilized During Treatment: Gait belt;Right knee immobilizer Activity Tolerance: Patient limited by pain;Patient limited by fatigue Patient left: with call bell/phone within reach;with bed alarm set   PT Visit Diagnosis: Muscle weakness (generalized) (M62.81);Difficulty in walking, not elsewhere classified (R26.2);Pain Pain - Right/Left: Right Pain - part of body: Knee     Time: 7322-0254 PT Time Calculation (min) (ACUTE ONLY): 36 min  Charges:  $Gait Training: 8-22 mins $Therapeutic Exercise: 8-22 mins                         Faye Ramsay, PT Acute Rehabilitation  Office: [phone] Pager: [phone]

## 2021-05-30 NOTE — Progress Notes (Addendum)
Physical Therapy Treatment Patient Details Name: Brittany Morris MRN: [id number] DOB: Sep 05, 1965 Today's Date: 05/30/2021   History of Present Illness Patient is 55 y.o. female s/p Rt TKA on 05/25/21 with PMH significant for OA, DM, HTN, sarcoidosis, back surgery.    PT Comments    Min A for mobility. Pt walked ~35 feet this afternoon.  Moderate pain with activity. Will continue PT with plan for d/c home on tomorrow if pt is able to meet PT goals.   Addendum: Daughter stopped me around 16:30 to tell me she has some concerns about patient's cognition level. Made RN aware that daughter would like to speak with her.     Recommendations for follow up therapy are one component of a multi-disciplinary discharge planning process, led by the attending physician.  Recommendations may be updated based on patient status, additional functional criteria and insurance authorization.  Follow Up Recommendations  Follow surgeon's recommendation for DC plan and follow-up therapies; 24 hour supervision/assist     Equipment Recommendations  None recommended by PT    Recommendations for Other Services       Precautions / Restrictions Precautions Precautions: Fall;Knee Precaution Comments: reviewed no pillow under knee Restrictions Weight Bearing Restrictions: No RLE Weight Bearing: Weight bearing as tolerated     Mobility  Bed Mobility Overal bed mobility: Needs Assistance Bed Mobility: Supine to Sit;Sit to Supine     Supine to sit: Min assist;HOB elevated Sit to supine: Min assist;HOB elevated   General bed mobility comments: Assist for R LE. Increased time.    Transfers Overall transfer level: Needs assistance Equipment used: Rolling walker (2 wheeled) Transfers: Sit to/from Stand Sit to Stand: Min assist;From elevated surface         General transfer comment: Assist to rise, steady, control descent. Cues for safety, technique, hand/LE placement. Increased  time.  Ambulation/Gait Ambulation/Gait assistance: Min assist Gait Distance (Feet): 35 Feet Assistive device: Rolling walker (2 wheeled) Gait Pattern/deviations: Step-to pattern     General Gait Details: Cues for safety, technique, sequence, proper use of RW, and for pt to gradually try to put weight though R LE. Slow gait speed. Assist to steady throughout.   Stairs             Wheelchair Mobility    Modified Rankin (Stroke Patients Only)       Balance Overall balance assessment: Needs assistance         Standing balance support: Bilateral upper extremity supported Standing balance-Leahy Scale: Poor                              Cognition Arousal/Alertness: Awake/alert Behavior During Therapy: WFL for tasks assessed/performed Overall Cognitive Status: Within Functional Limits for tasks assessed                                 General Comments: drowsy at times      Exercises      General Comments        Pertinent Vitals/Pain Pain Assessment: 0-10 Pain Score: 6  Pain Location: R knee/LE Pain Descriptors / Indicators: Sharp;Discomfort;Sore;Guarding;Grimacing Pain Intervention(s): Limited activity within patient's tolerance;Monitored during session;Ice applied;Repositioned    Home Living                      Prior Function  PT Goals (current goals can now be found in the care plan section) Progress towards PT goals: Progressing toward goals    Frequency    7X/week      PT Plan Current plan remains appropriate    Co-evaluation              AM-PAC PT "6 Clicks" Mobility   Outcome Measure  Help needed turning from your back to your side while in a flat bed without using bedrails?: A Little Help needed moving from lying on your back to sitting on the side of a flat bed without using bedrails?: A Little Help needed moving to and from a bed to a chair (including a wheelchair)?: A  Little Help needed standing up from a chair using your arms (e.g., wheelchair or bedside chair)?: A Little Help needed to walk in hospital room?: A Lot Help needed climbing 3-5 steps with a railing? : A Lot 6 Click Score: 16    End of Session Equipment Utilized During Treatment: Gait belt;Right knee immobilizer Activity Tolerance: Patient limited by pain;Patient limited by fatigue Patient left: with call bell/phone within reach;with bed alarm set   PT Visit Diagnosis: Muscle weakness (generalized) (M62.81);Difficulty in walking, not elsewhere classified (R26.2);Pain Pain - Right/Left: Right Pain - part of body: Knee     Time: 6659-9357 PT Time Calculation (min) (ACUTE ONLY): 21 min  Charges:  $Gait Training: 8-22 mins                        Faye Ramsay, PT Acute Rehabilitation  Office: [phone] Pager: [phone]

## 2021-05-30 NOTE — Progress Notes (Signed)
Patient ID: Brittany Morris, female   DOB: 1966-04-22, 55 y.o.   MRN: [id number] Each day the patient has made a little bit more progress with her mobility and therapy.  Given her severe chronic pain medication needs and chronic pain, she has had a longer hospitalization.  I did just speak with physical therapy and they are doing everything they can to evaluate the patient.  She does do better this morning.  She states that she feels like her daughter might not be able to help her as much.  I let her know that there is nothing to be recent to keep her in the hospital much longer.  We both agree that discharging her tomorrow would be acceptable.  Her right knee incision is stable.  I change the dressing.  Her calf is soft.  Her foot is neurovascularly intact.

## 2021-05-30 NOTE — Progress Notes (Signed)
Patient's family reports that patient appears confused when staff is not present. Patient is able to focus and answer questions w/ RN but patient's DTR reports that as the patient falls asleep she will start talking and saying nonsensical things, as if talking in her sleep. Will also reach out with hands as if reaching for something. Patient awakens easily. Hand grips equal bilaterally. Face symmetrical. Will encourage delayed use of pain meds, though patient reports her pain at 9-10/ 10. Will cont to monitor.

## 2021-05-31 ENCOUNTER — Encounter (HOSPITAL_COMMUNITY): Payer: Self-pay | Admitting: Orthopaedic Surgery

## 2021-05-31 LAB — GLUCOSE, CAPILLARY
Glucose-Capillary: 106 mg/dL — ABNORMAL HIGH (ref 70–99)
Glucose-Capillary: 212 mg/dL — ABNORMAL HIGH (ref 70–99)
Glucose-Capillary: 209 mg/dL — ABNORMAL HIGH (ref 70–99)
Glucose-Capillary: 203 mg/dL — ABNORMAL HIGH (ref 70–99)

## 2021-05-31 NOTE — Progress Notes (Signed)
Patient ID: Brittany Morris, female   DOB: 06/22/1966, 55 y.o.   MRN: [id number] My plan is to come by and see the patient sometime around noon and hopefully discharge her to home this afternoon.

## 2021-05-31 NOTE — Progress Notes (Signed)
Physical Therapy Treatment Patient Details Name: Brittany Morris MRN: [id number] DOB: 1966-04-27 Today's Date: 05/31/2021   History of Present Illness Patient is 55 y.o. female s/p Rt TKA on 05/25/21 with PMH significant for OA, DM, HTN, sarcoidosis, back surgery.    PT Comments    Awakened pt for 2nd session. Once pt sitting at [facility], she immediately asked "did I get my pain medicine?" Checked chart then notified RN that pt was requesting pain meds. RN administered pain meds. Pt did not feel she could ambulate 2* pain. Assisted pt back to bed.  D/C plan is for ST rehab at [facility]. Will continue to follow and progress activity as tolerated.    Recommendations for follow up therapy are one component of a multi-disciplinary discharge planning process, led by the attending physician.  Recommendations may be updated based on patient status, additional functional criteria and insurance authorization.  Follow Up Recommendations  Follow surgeon's recommendation for DC plan and follow-up therapies; SNF     Equipment Recommendations  None recommended by PT    Recommendations for Other Services       Precautions / Restrictions Precautions Precautions: Fall;Knee Precaution Comments: reviewed no pillow under knee Restrictions Weight Bearing Restrictions: No RLE Weight Bearing: Weight bearing as tolerated     Mobility  Bed Mobility Overal bed mobility: Needs Assistance Bed Mobility: Sit to Supine     Supine to sit: Min assist;HOB elevated Sit to supine: Min assist   General bed mobility comments: Assist for R LE. Increased time.    Transfers Overall transfer level: Needs assistance Equipment used: Rolling walker (2 wheeled) Transfers: Sit to/from [employer] Sit to Stand: Min assist Stand pivot transfers: Min assist       General transfer comment: Assist to rise, steady, control descent. Cues for safety, technique, hand/LE placement. Increased time. Stand pivot,  bed to recliner, with RW. Pt did not put any weight on R LE 2* pain  Ambulation/Gait            Stairs             Wheelchair Mobility    Modified Rankin (Stroke Patients Only)       Balance Overall balance assessment: Needs assistance         Standing balance support: Bilateral upper extremity supported Standing balance-Leahy Scale: Poor                              Cognition Arousal/Alertness: Awake/alert Behavior During Therapy: WFL for tasks assessed/performed Overall Cognitive Status: Within Functional Limits for tasks assessed                                 General Comments: drowsy at times      Exercises Total Joint Exercises Ankle Circles/Pumps: AROM;Both;10 reps Quad Sets: AROM;Both;10 reps Short Arc Quad: 10 reps Heel Slides: AAROM;Right;10 reps;Supine (pt used gait belt to assist as well) Hip ABduction/ADduction: AAROM;AROM;Right;10 reps Straight Leg Raises: AROM;AAROM;Right;10 reps Goniometric ROM: ~10-50 (limited by pain)    General Comments        Pertinent Vitals/Pain Pain Assessment: Faces Pain Score: 5  Faces Pain Scale: Hurts whole lot Pain Location: R knee/LE Pain Descriptors / Indicators: Discomfort;Grimacing;Sore Pain Intervention(s): Patient requesting pain meds-RN notified;RN gave pain meds during session;Ice applied;Repositioned    Home Living  Prior Function            PT Goals (current goals can now be found in the care plan section) Progress towards PT goals: Progressing toward goals    Frequency    7X/week      PT Plan Current plan remains appropriate    Co-evaluation              AM-PAC PT "6 Clicks" Mobility   Outcome Measure  Help needed turning from your back to your side while in a flat bed without using bedrails?: A Little Help needed moving from lying on your back to sitting on the side of a flat bed without using bedrails?: A  Little Help needed moving to and from a bed to a chair (including a wheelchair)?: A Little Help needed standing up from a chair using your arms (e.g., wheelchair or bedside chair)?: A Little Help needed to walk in hospital room?: A Little Help needed climbing 3-5 steps with a railing? : A Lot 6 Click Score: 17    End of Session Equipment Utilized During Treatment: Gait belt Activity Tolerance: Patient limited by pain Patient left: in bed;with call bell/phone within reach;with family/visitor present   PT Visit Diagnosis: Muscle weakness (generalized) (M62.81);Difficulty in walking, not elsewhere classified (R26.2);Pain Pain - Right/Left: Right Pain - part of body: Knee     Time: 1444-1500 PT Time Calculation (min) (ACUTE ONLY): 16 min  Charges:  $Gait Training: 8-22 mins $Therapeutic Exercise: 8-22 mins $Therapeutic Activity: 8-22 mins                         Faye Ramsay, PT Acute Rehabilitation  Office: [phone] Pager: [phone]

## 2021-05-31 NOTE — TOC Progression Note (Signed)
Transition of Care [facility]) - Progression Note   Patient Details  Name: Brittany Morris MRN: [id number] Date of Birth: 24-Oct-1965  Transition of [facility]) CM/SW Contact  Ewing Schlein, LCSW Phone Number: 05/31/2021, 12:00 PM  Clinical Narrative: Patient and family want patient to discharge to SNF for rehab and orthopedist is agreeable. Patient is vaccinated, but not boosted for COVID. Patient is agreeable to booster if required for admission.  FL2 done; PASRR received. Initial referral faxed out. TOC awaiting bed offers.  Expected Discharge Plan: Skilled Nursing Facility Barriers to Discharge: SNF Pending bed offer  Expected Discharge Plan and Services Expected Discharge Plan: Skilled Nursing Facility Discharge Planning Services: CM Consult Post Acute Care Choice: Skilled Nursing Facility Living arrangements for the past 2 months: Apartment           DME Arranged: Dan Humphreys rolling DME Agency: [employer] Date DME Agency Contacted: 05/26/21 Time DME Agency Contacted: [phone] Representative spoke with at DME Agency: [facility] HH Arranged: PT HH Agency: [facility] Date [facility] Agency Contacted: 05/26/21 Time HH Agency Contacted: [phone] Representative spoke with at [facility] Agency: stacie  Readmission Risk Interventions No flowsheet data found.

## 2021-05-31 NOTE — NC FL2 (Deleted)
[place] MEDICAID FL2 LEVEL OF CARE SCREENING TOOL     IDENTIFICATION  Patient Name: Brittany Morris Birthdate: [birth year] Sex: female Admission Date (Current Location): 05/25/2021  [facility] [place] and [place] Number:  Producer, television/film/video and Address:  [facility],  [address] [place], [place] [address]      Provider Number: [id number]  Attending Physician Name and Address:  Kathryne Hitch,*  Relative Name and Phone Number:  Fartun Paradiso (daughter) Ph: [phone]    Current Level of Care: Hospital Recommended Level of Care: Skilled Nursing Facility Prior Approval Number:    Date Approved/Denied:   PASRR Number: [id number]  Discharge Plan: SNF    Current Diagnoses: Patient Active Problem List   Diagnosis Date Noted   Status post total right knee replacement 05/27/2021   Status post right knee replacement 05/25/2021   Unilateral primary osteoarthritis, right knee 03/06/2021   Acute respiratory failure with hypoxia (HCC) 11/29/2019   Hypertension    High cholesterol    Diabetes mellitus without complication (HCC)    COVID-19 virus infection    Hyponatremia    Nausea vomiting and diarrhea     Orientation RESPIRATION BLADDER Height & Weight     Self, Time, Situation, Place  O2 (2L/min PRN) Continent Weight: 241 lb (109.3 kg) Height:  5\' 6"  (167.6 cm)  BEHAVIORAL SYMPTOMS/MOOD NEUROLOGICAL BOWEL NUTRITION STATUS   (N/A)  (N/A) Continent Diet (Carb modified)  AMBULATORY STATUS COMMUNICATION OF NEEDS Skin   Limited Assist Verbally Surgical wounds                       Personal Care Assistance Level of Assistance  Bathing, Dressing, Feeding Bathing Assistance: Limited assistance Feeding assistance: Independent Dressing Assistance: Limited assistance     Functional Limitations Info  Sight, Hearing, Speech Sight Info: Impaired Hearing Info: Adequate Speech Info: Adequate    SPECIAL CARE FACTORS FREQUENCY  PT (By licensed PT), OT (By  licensed OT)     PT Frequency: 5x's/week OT Frequency: 5x's/week            Contractures Contractures Info: Not present    Additional Factors Info  Code Status, Allergies Code Status Info: Full Allergies Info: Baclofen, Ciprofloxacin           Current Medications (05/31/2021):  This is the current hospital active medication list Current Facility-Administered Medications  Medication Dose Route Frequency Provider Last Rate Last Admin   0.9 %  sodium chloride infusion   Intravenous Continuous 07/31/2021, MD   Stopped at 05/26/21 1539   acetaminophen (TYLENOL) tablet 325-650 mg  325-650 mg Oral Q6H PRN 07/26/21, MD   650 mg at 05/31/21 1018   alum & mag hydroxide-simeth (MAALOX/MYLANTA) 200-200-20 MG/5ML suspension 30 mL  30 mL Oral Q4H PRN 02-26-2001, MD       amLODipine (NORVASC) tablet 5 mg  5 mg Oral Daily Kathryne Hitch, MD   5 mg at 05/31/21 1018   apixaban (ELIQUIS) tablet 2.5 mg  2.5 mg Oral BID 07/31/21, MD   2.5 mg at 05/31/21 1019   ascorbic acid (VITAMIN C) tablet 1,000 mg  1,000 mg Oral Daily 07/31/21, MD   1,000 mg at 05/31/21 1018   budesonide (ENTOCORT EC) 24 hr capsule 6 mg  6 mg Oral Daily 07/31/21, MD   6 mg at 05/31/21 1017   cholecalciferol (VITAMIN D3) tablet 4,000 Units  4,000 Units Oral Daily  Kathryne Hitch, MD   4,000 Units at 05/31/21 1023   diphenhydrAMINE (BENADRYL) 12.5 MG/5ML elixir 12.5-25 mg  12.5-25 mg Oral Q4H PRN Kathryne Hitch, MD       divalproex (DEPAKOTE) DR tablet 500 mg  500 mg Oral BID Kathryne Hitch, MD   500 mg at 05/31/21 1017   docusate sodium (COLACE) capsule 100 mg  100 mg Oral BID Kathryne Hitch, MD   100 mg at 05/31/21 1023   DULoxetine (CYMBALTA) DR capsule 60 mg  60 mg Oral BID Kathryne Hitch, MD   60 mg at 05/31/21 1018   empagliflozin (JARDIANCE) tablet 10 mg  10 mg Oral Daily Kathryne Hitch, MD   10 mg at 05/31/21 1018   ferrous sulfate tablet 325 mg  325 mg Oral Daily Kathryne Hitch, MD   325 mg at 05/31/21 1018   furosemide (LASIX) tablet 20 mg  20 mg Oral Moreen Fowler, MD   20 mg at 05/30/21 0934   gabapentin (NEURONTIN) capsule 900 mg  900 mg Oral BID Kathryne Hitch, MD   900 mg at 05/31/21 1018   HYDROmorphone (DILAUDID) injection 1-2 mg  1-2 mg Intravenous Q2H PRN Kathryne Hitch, MD   2 mg at 05/26/21 1051   HYDROmorphone (DILAUDID) tablet 4 mg  4 mg Oral Q4H PRN Kathryne Hitch, MD   4 mg at 05/30/21 1620   insulin aspart (novoLOG) injection 0-9 Units  0-9 Units Subcutaneous TID WC Kathryne Hitch, MD   2 Units at 05/30/21 1719   insulin NPH Human (NOVOLIN N) injection 15 Units  15 Units Subcutaneous QHS Kathryne Hitch, MD   15 Units at 05/30/21 2301   insulin NPH Human (NOVOLIN N) injection 20 Units  20 Units Subcutaneous QAC breakfast Kathryne Hitch, MD   20 Units at 05/31/21 1025   lisinopril (ZESTRIL) tablet 40 mg  40 mg Oral Daily Kathryne Hitch, MD   40 mg at 05/31/21 1017   magnesium oxide (MAG-OX) tablet 400 mg  400 mg Oral QHS Kathryne Hitch, MD   400 mg at 05/30/21 2300   magnesium oxide (MAG-OX) tablet 800 mg  800 mg Oral q morning Kathryne Hitch, MD   800 mg at 05/31/21 1023   menthol-cetylpyridinium (CEPACOL) lozenge 3 mg  1 lozenge Oral PRN Kathryne Hitch, MD       Or   phenol (CHLORASEPTIC) mouth spray 1 spray  1 spray Mouth/Throat PRN Kathryne Hitch, MD       meperidine (DEMEROL) injection 50 mg  50 mg Intravenous Once Kathryne Hitch, MD       methocarbamol (ROBAXIN) tablet 500 mg  500 mg Oral Q6H PRN Kathryne Hitch, MD   500 mg at 05/31/21 1018   Or   methocarbamol (ROBAXIN) 500 mg in dextrose 5 % 50 mL IVPB  500 mg Intravenous Q6H PRN Kathryne Hitch, MD       metoCLOPramide (REGLAN) tablet 5-10 mg   5-10 mg Oral Q8H PRN Kathryne Hitch, MD       Or   metoCLOPramide (REGLAN) injection 5-10 mg  5-10 mg Intravenous Q8H PRN Kathryne Hitch, MD   10 mg at 05/26/21 1211   ondansetron (ZOFRAN) tablet 4 mg  4 mg Oral Q6H PRN Kathryne Hitch, MD   4 mg at 05/30/21 1621   Or   ondansetron (ZOFRAN) injection 4 mg  4  mg Intravenous Q6H PRN Kathryne Hitch, MD   4 mg at 05/27/21 1036   oxyCODONE (Oxy IR/ROXICODONE) immediate release tablet 10-15 mg  10-15 mg Oral Q4H PRN Kathryne Hitch, MD   15 mg at 05/31/21 1018   oxyCODONE (Oxy IR/ROXICODONE) immediate release tablet 5-10 mg  5-10 mg Oral Q4H PRN Kathryne Hitch, MD   10 mg at 05/30/21 0652   pantoprazole (PROTONIX) EC tablet 40 mg  40 mg Oral BID Kathryne Hitch, MD   40 mg at 05/31/21 1018   polyethylene glycol (MIRALAX / GLYCOLAX) packet 17 g  17 g Oral Daily PRN Kathryne Hitch, MD   17 g at 05/31/21 1016   predniSONE (DELTASONE) tablet 2.5 mg  2.5 mg Oral Q breakfast Kathryne Hitch, MD   2.5 mg at 05/31/21 1017   promethazine (PHENERGAN) tablet 12.5 mg  12.5 mg Oral Q6H PRN Kathryne Hitch, MD   12.5 mg at 05/26/21 1403   simvastatin (ZOCOR) tablet 40 mg  40 mg Oral QHS Kathryne Hitch, MD   40 mg at 05/30/21 2300     Discharge Medications: Please see discharge summary for a list of discharge medications.  Relevant Imaging Results:  Relevant Lab Results:   Additional Information SSN: [id number]  Ewing Schlein, LCSW

## 2021-05-31 NOTE — Progress Notes (Signed)
Patient ID: Brittany Morris, female   DOB: 1966-05-26, 55 y.o.   MRN: [id number] I spoke with the patient at the bedside as well as her daughter.  Physical therapy was also working with her at the time.  It is definitely not safe for being able to discharge to home from a mobility standpoint and her being a significant fall risk.  Short-term skilled nursing has been recommended.  A consult is been put into the transitional care team to help facilitate this.

## 2021-05-31 NOTE — Care Management Important Message (Signed)
Important Message  Patient Details IM Letter given to the Patient. Name: Brittany Morris MRN: [id number] Date of Birth: [birth year]   Medicare Important Message Given:  Yes     Caren Macadam 05/31/2021, 12:54 PM

## 2021-05-31 NOTE — Progress Notes (Addendum)
Physical Therapy Treatment Patient Details Name: Brittany Morris MRN: [id number] DOB: 07/05/1966 Today's Date: 05/31/2021   History of Present Illness Patient is 55 y.o. female s/p Rt TKA on 05/25/21 with PMH significant for OA, DM, HTN, sarcoidosis, back surgery.    PT Comments    Pt's progress has fluctuated throughout hospital stay. She walked ~15' x 2 on today with overall Min A. Vitals: BP119/70, O2 95% RA, HR 132 bpm. Moderate pain with activity. Total A for toilet hygiene. Discussed d/c plan with pt/family-family would like ST rehab at SNF before pt returns home. MD arrived and discussed this with pt/family as well. He is agreeable to SNF placement. PT recommendation has been updated to SNF.     Recommendations for follow up therapy are one component of a multi-disciplinary discharge planning process, led by the attending physician.  Recommendations may be updated based on patient status, additional functional criteria and insurance authorization.  Follow Up Recommendations  Follow surgeon's recommendation for DC plan and follow-up therapies; SNF     Equipment Recommendations  None recommended by PT    Recommendations for Other Services       Precautions / Restrictions Precautions Precautions: Fall;Knee Restrictions Weight Bearing Restrictions: No RLE Weight Bearing: Weight bearing as tolerated     Mobility  Bed Mobility Overal bed mobility: Needs Assistance Bed Mobility: Supine to Sit     Supine to sit: Min assist;HOB elevated     General bed mobility comments: Assist for R LE. Increased time.    Transfers Overall transfer level: Needs assistance Equipment used: Rolling walker (2 wheeled) Transfers: Sit to/from Stand Sit to Stand: Min assist         General transfer comment: Assist to rise, steady, control descent. Cues for safety, technique, hand/LE placement. Increased time.  Ambulation/Gait Ambulation/Gait assistance: Min assist Gait Distance  (Feet): 15 Feet (x2) Assistive device: Rolling walker (2 wheeled) Gait Pattern/deviations: Step-to pattern     General Gait Details: Cues for safety, technique, sequence, proper use of RW, and for pt to gradually try to put weight though R LE. Slow gait speed. Assist to steady throughout.   Stairs             Wheelchair Mobility    Modified Rankin (Stroke Patients Only)       Balance Overall balance assessment: Needs assistance         Standing balance support: Bilateral upper extremity supported Standing balance-Leahy Scale: Poor                              Cognition Arousal/Alertness: Awake/alert Behavior During Therapy: WFL for tasks assessed/performed Overall Cognitive Status: Within Functional Limits for tasks assessed                                 General Comments: drowsy at times      Exercises Total Joint Exercises Ankle Circles/Pumps: AROM;Both;10 reps Quad Sets: AROM;Both;10 reps Short Arc Quad: 10 reps Heel Slides: AAROM;Right;10 reps;Supine (pt used gait belt to assist as well) Hip ABduction/ADduction: AAROM;AROM;Right;10 reps Straight Leg Raises: AROM;AAROM;Right;10 reps Goniometric ROM: ~10-50 (limited by pain)    General Comments        Pertinent Vitals/Pain Pain Assessment: 0-10 Pain Score: 5  Pain Location: R knee/LE Pain Descriptors / Indicators: Discomfort;Sore Pain Intervention(s): Limited activity within patient's tolerance;Ice applied;Repositioned    Home Living  Prior Function            PT Goals (current goals can now be found in the care plan section) Progress towards PT goals: Progressing toward goals    Frequency    7X/week      PT Plan Current plan remains appropriate    Co-evaluation              AM-PAC PT "6 Clicks" Mobility   Outcome Measure  Help needed turning from your back to your side while in a flat bed without using bedrails?: A  Little Help needed moving from lying on your back to sitting on the side of a flat bed without using bedrails?: A Little Help needed moving to and from a bed to a chair (including a wheelchair)?: A Little Help needed standing up from a chair using your arms (e.g., wheelchair or bedside chair)?: A Little Help needed to walk in hospital room?: A Little Help needed climbing 3-5 steps with a railing? : A Lot 6 Click Score: 17    End of Session Equipment Utilized During Treatment: Gait belt Activity Tolerance: Patient tolerated treatment well Patient left: in chair;with call bell/phone within reach   PT Visit Diagnosis: Muscle weakness (generalized) (M62.81);Difficulty in walking, not elsewhere classified (R26.2);Pain Pain - Right/Left: Right Pain - part of body: Knee     Time: 1110-1202 PT Time Calculation (min) (ACUTE ONLY): 52 min  Charges:  $Gait Training: 8-22 mins $Therapeutic Exercise: 8-22 mins $Therapeutic Activity: 8-22 mins                        Faye Ramsay, PT Acute Rehabilitation  Office: [phone] Pager: [phone]

## 2021-05-31 NOTE — [place] FL2 (Signed)
[place] MEDICAID FL2 LEVEL OF CARE SCREENING TOOL     IDENTIFICATION  Patient Name: Brittany Morris Birthdate: 01-17-1966 Sex: female Admission Date (Current Location): 05/25/2021  [facility] and [place] Number:  Producer, television/film/video and Address:  [facility],  [address] [place], [place] [address]      Provider Number: [id number]  Attending Physician Name and Address:  Kathryne Hitch,*  Relative Name and Phone Number:  Taresa [place] (daughter) Ph: [phone]    Current Level of Care: Hospital Recommended Level of Care: Skilled Nursing Facility Prior Approval Number:    Date Approved/Denied:   PASRR Number: [id number]  Discharge Plan: SNF    Current Diagnoses: Patient Active Problem List   Diagnosis Date Noted   Status post total right knee replacement 05/27/2021   Status post right knee replacement 05/25/2021   Unilateral primary osteoarthritis, right knee 03/06/2021   Acute respiratory failure with hypoxia (HCC) 11/29/2019   Hypertension    High cholesterol    Diabetes mellitus without complication (HCC)    COVID-19 virus infection    Hyponatremia    Nausea vomiting and diarrhea     Orientation RESPIRATION BLADDER Height & Weight     Self, Time, Situation, Place  O2 (2L/min PRN) Continent Weight: 241 lb (109.3 kg) Height:  5\' 6"  (167.6 cm)  BEHAVIORAL SYMPTOMS/MOOD NEUROLOGICAL BOWEL NUTRITION STATUS   (N/A)  (N/A) Continent Diet (Carb modified)  AMBULATORY STATUS COMMUNICATION OF NEEDS Skin   Limited Assist Verbally Surgical wounds                       Personal Care Assistance Level of Assistance  Bathing, Dressing, Feeding Bathing Assistance: Limited assistance Feeding assistance: Independent Dressing Assistance: Limited assistance     Functional Limitations Info  Sight, Hearing, Speech Sight Info: Impaired Hearing Info: Adequate Speech Info: Adequate    SPECIAL CARE FACTORS FREQUENCY  PT (By licensed PT), OT (By  licensed OT)     PT Frequency: 5x's/week OT Frequency: 5x's/week            Contractures Contractures Info: Not present    Additional Factors Info  Code Status, Allergies, Psychotropic Code Status Info: Full Allergies Info: Baclofen, Ciprofloxacin Psychotropic Info: Depakote, Cymbalta, Neurontin         Current Medications (05/31/2021):  This is the current hospital active medication list Current Facility-Administered Medications  Medication Dose Route Frequency Provider Last Rate Last Admin   0.9 %  sodium chloride infusion   Intravenous Continuous 07/31/2021, MD   Stopped at 05/26/21 1539   acetaminophen (TYLENOL) tablet 325-650 mg  325-650 mg Oral Q6H PRN 07/26/21, MD   650 mg at 05/31/21 1018   alum & mag hydroxide-simeth (MAALOX/MYLANTA) 200-200-20 MG/5ML suspension 30 mL  30 mL Oral Q4H PRN 02-26-2001, MD       amLODipine (NORVASC) tablet 5 mg  5 mg Oral Daily Kathryne Hitch, MD   5 mg at 05/31/21 1018   apixaban (ELIQUIS) tablet 2.5 mg  2.5 mg Oral BID 07/31/21, MD   2.5 mg at 05/31/21 1019   ascorbic acid (VITAMIN C) tablet 1,000 mg  1,000 mg Oral Daily 07/31/21, MD   1,000 mg at 05/31/21 1018   budesonide (ENTOCORT EC) 24 hr capsule 6 mg  6 mg Oral Daily 07/31/21, MD   6 mg at 05/31/21 1017   cholecalciferol (VITAMIN D3) tablet 4,000 Units  4,000 Units Oral Daily Kathryne Hitch, MD   4,000 Units at 05/31/21 1023   diphenhydrAMINE (BENADRYL) 12.5 MG/5ML elixir 12.5-25 mg  12.5-25 mg Oral Q4H PRN Kathryne Hitch, MD       divalproex (DEPAKOTE) DR tablet 500 mg  500 mg Oral BID Kathryne Hitch, MD   500 mg at 05/31/21 1017   docusate sodium (COLACE) capsule 100 mg  100 mg Oral BID Kathryne Hitch, MD   100 mg at 05/31/21 1023   DULoxetine (CYMBALTA) DR capsule 60 mg  60 mg Oral BID Kathryne Hitch, MD   60 mg at 05/31/21 1018   empagliflozin  (JARDIANCE) tablet 10 mg  10 mg Oral Daily Kathryne Hitch, MD   10 mg at 05/31/21 1018   ferrous sulfate tablet 325 mg  325 mg Oral Daily Kathryne Hitch, MD   325 mg at 05/31/21 1018   furosemide (LASIX) tablet 20 mg  20 mg Oral Moreen Fowler, MD   20 mg at 05/30/21 0934   gabapentin (NEURONTIN) capsule 900 mg  900 mg Oral BID Kathryne Hitch, MD   900 mg at 05/31/21 1018   HYDROmorphone (DILAUDID) injection 1-2 mg  1-2 mg Intravenous Q2H PRN Kathryne Hitch, MD   2 mg at 05/26/21 1051   HYDROmorphone (DILAUDID) tablet 4 mg  4 mg Oral Q4H PRN Kathryne Hitch, MD   4 mg at 05/30/21 1620   insulin aspart (novoLOG) injection 0-9 Units  0-9 Units Subcutaneous TID WC Kathryne Hitch, MD   2 Units at 05/30/21 1719   insulin NPH Human (NOVOLIN N) injection 15 Units  15 Units Subcutaneous QHS Kathryne Hitch, MD   15 Units at 05/30/21 2301   insulin NPH Human (NOVOLIN N) injection 20 Units  20 Units Subcutaneous QAC breakfast Kathryne Hitch, MD   20 Units at 05/31/21 1025   lisinopril (ZESTRIL) tablet 40 mg  40 mg Oral Daily Kathryne Hitch, MD   40 mg at 05/31/21 1017   magnesium oxide (MAG-OX) tablet 400 mg  400 mg Oral QHS Kathryne Hitch, MD   400 mg at 05/30/21 2300   magnesium oxide (MAG-OX) tablet 800 mg  800 mg Oral q morning Kathryne Hitch, MD   800 mg at 05/31/21 1023   menthol-cetylpyridinium (CEPACOL) lozenge 3 mg  1 lozenge Oral PRN Kathryne Hitch, MD       Or   phenol (CHLORASEPTIC) mouth spray 1 spray  1 spray Mouth/Throat PRN Kathryne Hitch, MD       meperidine (DEMEROL) injection 50 mg  50 mg Intravenous Once Kathryne Hitch, MD       methocarbamol (ROBAXIN) tablet 500 mg  500 mg Oral Q6H PRN Kathryne Hitch, MD   500 mg at 05/31/21 1018   Or   methocarbamol (ROBAXIN) 500 mg in dextrose 5 % 50 mL IVPB  500 mg Intravenous Q6H PRN Kathryne Hitch, MD       metoCLOPramide (REGLAN) tablet 5-10 mg  5-10 mg Oral Q8H PRN Kathryne Hitch, MD       Or   metoCLOPramide (REGLAN) injection 5-10 mg  5-10 mg Intravenous Q8H PRN Kathryne Hitch, MD   10 mg at 05/26/21 1211   ondansetron (ZOFRAN) tablet 4 mg  4 mg Oral Q6H PRN Kathryne Hitch, MD   4 mg at 05/30/21 1621   Or   ondansetron (ZOFRAN) injection  4 mg  4 mg Intravenous Q6H PRN Kathryne Hitch, MD   4 mg at 05/27/21 1036   oxyCODONE (Oxy IR/ROXICODONE) immediate release tablet 10-15 mg  10-15 mg Oral Q4H PRN Kathryne Hitch, MD   15 mg at 05/31/21 1018   oxyCODONE (Oxy IR/ROXICODONE) immediate release tablet 5-10 mg  5-10 mg Oral Q4H PRN Kathryne Hitch, MD   10 mg at 05/30/21 0652   pantoprazole (PROTONIX) EC tablet 40 mg  40 mg Oral BID Kathryne Hitch, MD   40 mg at 05/31/21 1018   polyethylene glycol (MIRALAX / GLYCOLAX) packet 17 g  17 g Oral Daily PRN Kathryne Hitch, MD   17 g at 05/31/21 1016   predniSONE (DELTASONE) tablet 2.5 mg  2.5 mg Oral Q breakfast Kathryne Hitch, MD   2.5 mg at 05/31/21 1017   promethazine (PHENERGAN) tablet 12.5 mg  12.5 mg Oral Q6H PRN Kathryne Hitch, MD   12.5 mg at 05/26/21 1403   simvastatin (ZOCOR) tablet 40 mg  40 mg Oral QHS Kathryne Hitch, MD   40 mg at 05/30/21 2300     Discharge Medications: Please see discharge summary for a list of discharge medications.  Relevant Imaging Results:  Relevant Lab Results:   Additional Information SSN: [id number]  Ewing Schlein, LCSW

## 2021-06-01 LAB — GLUCOSE, CAPILLARY
Glucose-Capillary: 110 mg/dL — ABNORMAL HIGH (ref 70–99)
Glucose-Capillary: 256 mg/dL — ABNORMAL HIGH (ref 70–99)
Glucose-Capillary: 250 mg/dL — ABNORMAL HIGH (ref 70–99)
Glucose-Capillary: 201 mg/dL — ABNORMAL HIGH (ref 70–99)

## 2021-06-01 LAB — SARS CORONAVIRUS 2 (TAT 6-24 HRS): SARS Coronavirus 2: NEGATIVE

## 2021-06-01 MED ORDER — APIXABAN 2.5 MG PO TABS: 2.5000 mg | ORAL_TABLET | Freq: Two times a day (BID) | ORAL | 0 refills | Status: DC

## 2021-06-01 MED ORDER — OXYCODONE HCL 5 MG PO TABS: 5.0000 mg | ORAL_TABLET | ORAL | 0 refills | Status: DC | PRN

## 2021-06-01 MED ORDER — HYDROMORPHONE HCL 4 MG PO TABS: 4.0000 mg | ORAL_TABLET | ORAL | 0 refills | Status: DC | PRN

## 2021-06-01 NOTE — Discharge Summary (Signed)
Patient ID: Brittany Morris MRN: [id number] DOB/AGE: Jul 06, 1966 55 y.o.  Admit date: 05/25/2021 Discharge date: 06/01/2021  Admission Diagnoses:  Principal Problem:   Unilateral primary osteoarthritis, right knee Active Problems:   Status post right knee replacement   Status post total right knee replacement   Discharge Diagnoses:  Same  Past Medical History:  Diagnosis Date   Arthritis    Complication of anesthesia    difficulty with breathing after and waking up   Diabetes mellitus without complication (HCC)    Headache    migraines   High cholesterol    Hypertension    Nerve pain    Pneumonia    Sarcoidosis     Surgeries: Procedure(s): RIGHT TOTAL KNEE ARTHROPLASTY on 05/25/2021   Consultants:   Discharged Condition: Improved  Hospital Course: Brittany Morris is an 55 y.o. female who was admitted 05/25/2021 for operative treatment ofUnilateral primary osteoarthritis, right knee. Patient has severe unremitting pain that affects sleep, daily activities, and work/hobbies. After pre-op clearance the patient was taken to the operating room on 05/25/2021 and underwent  Procedure(s): RIGHT TOTAL KNEE ARTHROPLASTY.    Patient was given perioperative antibiotics:  Anti-infectives (From admission, onward)    Start     Dose/Rate Route Frequency Ordered Stop   05/25/21 2200  vancomycin (VANCOREADY) IVPB 1500 mg/300 mL        1,500 mg 150 mL/hr over 120 Minutes Intravenous Every 12 hours 05/25/21 1421 05/26/21 0006   05/25/21 0645  ceFAZolin (ANCEF) IVPB 2g/100 mL premix        2 g 200 mL/hr over 30 Minutes Intravenous On call to O.R. 05/25/21 7048 05/25/21 0946   05/25/21 0642  vancomycin (VANCOREADY) IVPB 1500 mg/300 mL        1,500 mg 150 mL/hr over 120 Minutes Intravenous 60 min pre-op 05/25/21 0642 05/25/21 1004   05/05/21 0600  vancomycin (VANCOREADY) IVPB 1500 mg/300 mL  Status:  Discontinued        1,500 mg 150 mL/hr over 120 Minutes Intravenous  Once 05/04/21  0817 05/24/21 0716        Patient was given sequential compression devices, early ambulation, and chemoprophylaxis to prevent DVT.  Patient benefited maximally from hospital stay and there were no complications.    Recent vital signs: Patient Vitals for the past 24 hrs:  BP Temp Temp src Pulse Resp SpO2  06/01/21 1102 140/77 98.8 F (37.1 C) Oral 81 18 --  06/01/21 0826 (!) 143/69 98.6 F (37 C) Oral 94 16 --  05/31/21 2013 136/76 98.2 F (36.8 C) Oral 89 18 98 %  05/31/21 1350 130/71 97.9 F (36.6 C) -- 94 16 (!) 87 %     Recent laboratory studies: No results for input(s): WBC, HGB, HCT, PLT, NA, K, CL, CO2, BUN, CREATININE, GLUCOSE, INR, CALCIUM in the last 72 hours.  Invalid input(s): PT, 2   Discharge Medications:   Allergies as of 06/01/2021       Reactions   Baclofen    Dizziness   Ciprofloxacin Rash        Medication List     TAKE these medications    acetaminophen 500 MG tablet Commonly known as: TYLENOL Take 1,000 mg by mouth every 6 (six) hours as needed for moderate pain.   amLODipine 5 MG tablet Commonly known as: NORVASC Take 5 mg by mouth daily.   apixaban 2.5 MG Tabs tablet Commonly known as: ELIQUIS Take 1 tablet (2.5 mg total) by mouth 2 (two)  times daily.   budesonide 3 MG 24 hr capsule Commonly known as: ENTOCORT EC Take 6 mg by mouth daily.   CALCIUM 500 PO Take 500 mg by mouth 2 (two) times daily.   divalproex 500 MG DR tablet Commonly known as: DEPAKOTE Take 500 mg by mouth 2 (two) times daily.   DULoxetine 60 MG capsule Commonly known as: CYMBALTA Take 60 mg by mouth 2 (two) times daily.   empagliflozin 10 MG Tabs tablet Commonly known as: JARDIANCE Take 10 mg by mouth daily.   ferrous sulfate 325 (65 FE) MG tablet Take 325 mg by mouth daily.   furosemide 20 MG tablet Commonly known as: LASIX Take 20 mg by mouth every other day.   gabapentin 300 MG capsule Commonly known as: NEURONTIN Take 900 mg by mouth 2 (two)  times daily.   insulin lispro 100 UNIT/ML injection Commonly known as: HUMALOG Inject 5-8 Units into the skin 3 (three) times daily before meals.   insulin NPH Human 100 UNIT/ML injection Commonly known as: NOVOLIN N Inject 15-20 Units into the skin See admin instructions. Inject 20 units in the morning and 15 units at night   lisinopril 40 MG tablet Commonly known as: ZESTRIL Take 40 mg by mouth daily.   Magnesium 400 MG Tabs Take 400-800 mg by mouth See admin instructions. Take 800 mg in the morning and 400 mg at night   methocarbamol 500 MG tablet Commonly known as: ROBAXIN Take 500-1,000 mg by mouth 2 (two) times daily as needed for muscle spasms.   MORPHINE SULFATE IV Inject into the vein. Pt has morphine pump   nystatin cream Commonly known as: MYCOSTATIN Apply 1 application topically 2 (two) times daily as needed for rash.   Omega-3 1000 MG Caps Take 1,000 mg by mouth 2 (two) times daily.   oxyCODONE 5 MG immediate release tablet Commonly known as: Oxy IR/ROXICODONE Take 1-2 tablets (5-10 mg total) by mouth every 4 (four) hours as needed for severe pain. What changed:  how much to take when to take this   pantoprazole 40 MG tablet Commonly known as: PROTONIX Take 40 mg by mouth 2 (two) times daily.   predniSONE 2.5 MG tablet Commonly known as: DELTASONE Take 2.5 mg by mouth daily.   promethazine 12.5 MG tablet Commonly known as: PHENERGAN Take 12.5 mg by mouth every 6 (six) hours as needed for nausea/vomiting.   Promethegan 25 MG suppository Generic drug: promethazine Place 25 mg rectally every 6 (six) hours as needed for nausea/vomiting.   rizatriptan 10 MG tablet Commonly known as: MAXALT Take 10 mg by mouth as needed for migraine. May repeat in 2 hours if needed   simvastatin 40 MG tablet Commonly known as: ZOCOR Take 40 mg by mouth at bedtime.   tiZANidine 4 MG tablet Commonly known as: ZANAFLEX Take 12 mg by mouth at bedtime.   Trulicity  0.75 MG/0.5ML Sopn Generic drug: Dulaglutide Inject 0.75 mg into the skin every Saturday.   vitamin C 1000 MG tablet Take 1,000 mg by mouth daily.   Vitamin D 50 MCG (2000 UT) tablet Take 4,000 Units by mouth daily.               Durable Medical Equipment  (From admission, onward)           Start     Ordered   05/25/21 1422  DME 3 n 1  Once        05/25/21 1421   05/25/21 1422  DME Walker rolling  Once       Question Answer Comment  Walker: With 5 Inch Wheels   Patient needs a walker to treat with the following condition Status post right knee replacement      05/25/21 1421            Diagnostic Studies: DG Knee Right Port  Result Date: 05/25/2021 CLINICAL DATA:  Status post total knee arthroplasty EXAM: PORTABLE RIGHT KNEE - 1-2 VIEW COMPARISON:  None. FINDINGS: There is a right total knee arthroplasty without evidence of loosening or periprosthetic fracture. Expected soft tissue changes. Normal alignment. IMPRESSION: Right total knee arthroplasty without evidence of immediate hardware complication. Electronically Signed   By: Caprice Renshaw M.D.   On: 05/25/2021 13:06    Disposition: Discharge disposition: 03-Skilled Nursing Facility          Contact information for follow-up providers     Kathryne Hitch, MD Follow up in 2 week(s).   Specialty: Orthopedic Surgery Contact information: [address] [place] [address] [phone]              Contact information for after-discharge care     Destination     [facility] Preferred SNF .   Service: Skilled Nursing Contact information: [address] [place] [address] [phone]                      Signed: Kathryne Hitch 06/01/2021, 12:07 PM

## 2021-06-01 NOTE — Progress Notes (Signed)
Patient ID: Brittany Morris, female   DOB: 01-31-1966, 55 y.o.   MRN: [id number] No acute changes.  Vitals stable and right knee stable.  Can be discharged to short-term skilled nursing today.

## 2021-06-01 NOTE — Progress Notes (Signed)
Physical Therapy Treatment Patient Details Name: Brittany Morris MRN: [id number] DOB: 1966-04-28 Today's Date: 06/01/2021   History of Present Illness Patient is 55 y.o. female s/p Rt TKA on 05/25/21 with PMH significant for OA, DM, HTN, sarcoidosis, back surgery.    PT Comments    POD # 7 am session Assisted to EOB.  X 10 min EOB working on R knee flexion.  Pt very guarded with anxiety/fear of pain.  Performed long static stretch to get heel to touch floor.  Then performed 10 reps LAQ's followed by 10 reps seated knee bends and assisted knee bends.  Knee flex approx 55 degrees.  Assisted with amb to bathroom.  General transfer comment: 25% VC's on proper hand placement and increased time.  Also assisted with a toilet transfer.  Unsteady.  Shaky.  50 % VC's to place "foot flat" and increases WB thru R LE to increase standing balance. General Gait Details: 75% VC's on proper sequencing and to increased WBing R LE with VC's to place "foot flat" on floor and avoid R stepping same time advancing walker.  Instructed on 3 step gait (walker, R, L)    Limited distance to bathroom then in hallway.  Shaky.  Unsteady esp with turns.  HIGH FALL RISK. Pt will need ST Rehab at SNF prior to safely D/C to home.   Recommendations for follow up therapy are one component of a multi-disciplinary discharge planning process, led by the attending physician.  Recommendations may be updated based on patient status, additional functional criteria and insurance authorization.  Follow Up Recommendations  SNF     Equipment Recommendations  None recommended by PT    Recommendations for Other Services       Precautions / Restrictions Precautions Precautions: Fall;Knee Precaution Comments: reviewed no pillow under knee Restrictions Weight Bearing Restrictions: No RLE Weight Bearing: Weight bearing as tolerated     Mobility  Bed Mobility Overal bed mobility: Needs Assistance Bed Mobility: Supine to Sit      Supine to sit: Min guard     General bed mobility comments: increased time and use of rail    Transfers Overall transfer level: Needs assistance Equipment used: Rolling walker (2 wheeled) Transfers: Sit to/from [employer] Sit to Stand: Min assist Stand pivot transfers: Min assist       General transfer comment: 25% VC's on proper hand placement and increased time.  Also assisted with a toilet transfer.  Unsteady.  Shaky.  50 % VC's to place "foot flat" and increases WB thru R LE to increase standing balance.  Ambulation/Gait Ambulation/Gait assistance: Min assist Gait Distance (Feet): 23 Feet Assistive device: Rolling walker (2 wheeled) Gait Pattern/deviations: Step-to pattern;Decreased stance time - right;Ataxic Gait velocity: decr   General Gait Details: 75% VC's on proper sequencing and to increased WBing R LE with VC's to place "foot flat" on floor and avoid R stepping same time advancing walker.  Instructed on 3 step gait (walker, R, L)    Limited distance to bathroom then in hallway.  Shaky.  Unsteady esp with turns.  HIGH FALL RISK.   Stairs             Wheelchair Mobility    Modified Rankin (Stroke Patients Only)       Balance  Cognition Arousal/Alertness: Awake/alert Behavior During Therapy: WFL for tasks assessed/performed Overall Cognitive Status: Within Functional Limits for tasks assessed                                 General Comments: AxO x 3 HIGH anxiety required distraction techniques to shift focus      Exercises  10 reps seated knee bends  10 reps seated assisted knee bends  10 reps LAQ's  Followed by ICE   General Comments         Pertinent Vitals/Pain Pain Assessment: Faces Faces Pain Scale: Hurts even more Pain Location: R knee/LE with activity Pain Descriptors / Indicators: Discomfort;Grimacing;Sore Pain Intervention(s): Monitored  during session;Premedicated before session;Repositioned;Ice applied    Home Living                      Prior Function            PT Goals (current goals can now be found in the care plan section) Progress towards PT goals: Progressing toward goals    Frequency    7X/week      PT Plan Current plan remains appropriate    Co-evaluation              AM-PAC PT "6 Clicks" Mobility   Outcome Measure  Help needed turning from your back to your side while in a flat bed without using bedrails?: A Little Help needed moving from lying on your back to sitting on the side of a flat bed without using bedrails?: A Little Help needed moving to and from a bed to a chair (including a wheelchair)?: A Little Help needed standing up from a chair using your arms (e.g., wheelchair or bedside chair)?: A Little Help needed to walk in hospital room?: A Little Help needed climbing 3-5 steps with a railing? : A Lot 6 Click Score: 17    End of Session Equipment Utilized During Treatment: Gait belt Activity Tolerance: Patient tolerated treatment well Patient left: in chair;with call bell/phone within reach Nurse Communication: Mobility status PT Visit Diagnosis: Muscle weakness (generalized) (M62.81);Difficulty in walking, not elsewhere classified (R26.2);Pain Pain - Right/Left: Right Pain - part of body: Knee     Time: 1200-1230 PT Time Calculation (min) (ACUTE ONLY): 30 min  Charges:  $Gait Training: 8-22 mins $Therapeutic Activity: 8-22 mins                     Felecia Shelling  PTA Acute  Rehabilitation Services Pager      [phone] Office      [phone]

## 2021-06-01 NOTE — Discharge Summary (Signed)
Patient ID: BEATRIS BELEN MRN: [id number] DOB/AGE: 54-Aug-1967 54 y.o.  Admit date: 05/25/2021 Discharge date: 06/01/2021  Admission Diagnoses:  Principal Problem:   Unilateral primary osteoarthritis, right knee Active Problems:   Status post right knee replacement   Status post total right knee replacement   Discharge Diagnoses:  Same  Past Medical History:  Diagnosis Date   Arthritis    Complication of anesthesia    difficulty with breathing after and waking up   Diabetes mellitus without complication (HCC)    Headache    migraines   High cholesterol    Hypertension    Nerve pain    Pneumonia    Sarcoidosis     Surgeries: Procedure(s): RIGHT TOTAL KNEE ARTHROPLASTY on 05/25/2021   Consultants:   Discharged Condition: Improved  Hospital Course: LONETTA BLASSINGAME is an 55 y.o. female who was admitted 05/25/2021 for operative treatment ofUnilateral primary osteoarthritis, right knee. Patient has severe unremitting pain that affects sleep, daily activities, and work/hobbies. After pre-op clearance the patient was taken to the operating room on 05/25/2021 and underwent  Procedure(s): RIGHT TOTAL KNEE ARTHROPLASTY.    Patient was given perioperative antibiotics:  Anti-infectives (From admission, onward)    Start     Dose/Rate Route Frequency Ordered Stop   05/25/21 2200  vancomycin (VANCOREADY) IVPB 1500 mg/300 mL        1,500 mg 150 mL/hr over 120 Minutes Intravenous Every 12 hours 05/25/21 1421 05/26/21 0006   05/25/21 0645  ceFAZolin (ANCEF) IVPB 2g/100 mL premix        2 g 200 mL/hr over 30 Minutes Intravenous On call to O.R. 05/25/21 5051 05/25/21 0946   05/25/21 0642  vancomycin (VANCOREADY) IVPB 1500 mg/300 mL        1,500 mg 150 mL/hr over 120 Minutes Intravenous 60 min pre-op 05/25/21 0642 05/25/21 1004   05/05/21 0600  vancomycin (VANCOREADY) IVPB 1500 mg/300 mL  Status:  Discontinued        1,500 mg 150 mL/hr over 120 Minutes Intravenous  Once 05/04/21 0817  05/24/21 0716        Patient was given sequential compression devices, early ambulation, and chemoprophylaxis to prevent DVT.  Patient benefited maximally from hospital stay and there were no complications.    Recent vital signs: Patient Vitals for the past 24 hrs:  BP Temp Temp src Pulse Resp SpO2  06/01/21 1102 140/77 98.8 F (37.1 C) Oral 81 18 --  06/01/21 0826 (!) 143/69 98.6 F (37 C) Oral 94 16 --  05/31/21 2013 136/76 98.2 F (36.8 C) Oral 89 18 98 %  05/31/21 1350 130/71 97.9 F (36.6 C) -- 94 16 (!) 87 %     Recent laboratory studies: No results for input(s): WBC, HGB, HCT, PLT, NA, K, CL, CO2, BUN, CREATININE, GLUCOSE, INR, CALCIUM in the last 72 hours.  Invalid input(s): PT, 2   Discharge Medications:   Allergies as of 06/01/2021       Reactions   Baclofen    Dizziness   Ciprofloxacin Rash        Medication List     STOP taking these medications    oxyCODONE 5 MG immediate release tablet Commonly known as: Oxy IR/ROXICODONE       TAKE these medications    acetaminophen 500 MG tablet Commonly known as: TYLENOL Take 1,000 mg by mouth every 6 (six) hours as needed for moderate pain.   amLODipine 5 MG tablet Commonly known as: NORVASC Take 5 mg  by mouth daily.   apixaban 2.5 MG Tabs tablet Commonly known as: ELIQUIS Take 1 tablet (2.5 mg total) by mouth 2 (two) times daily.   budesonide 3 MG 24 hr capsule Commonly known as: ENTOCORT EC Take 6 mg by mouth daily.   CALCIUM 500 PO Take 500 mg by mouth 2 (two) times daily.   divalproex 500 MG DR tablet Commonly known as: DEPAKOTE Take 500 mg by mouth 2 (two) times daily.   DULoxetine 60 MG capsule Commonly known as: CYMBALTA Take 60 mg by mouth 2 (two) times daily.   empagliflozin 10 MG Tabs tablet Commonly known as: JARDIANCE Take 10 mg by mouth daily.   ferrous sulfate 325 (65 FE) MG tablet Take 325 mg by mouth daily.   furosemide 20 MG tablet Commonly known as: LASIX Take 20  mg by mouth every other day.   gabapentin 300 MG capsule Commonly known as: NEURONTIN Take 900 mg by mouth 2 (two) times daily.   HYDROmorphone 4 MG tablet Commonly known as: DILAUDID Take 1 tablet (4 mg total) by mouth every 4 (four) hours as needed for severe pain.   insulin lispro 100 UNIT/ML injection Commonly known as: HUMALOG Inject 5-8 Units into the skin 3 (three) times daily before meals.   insulin NPH Human 100 UNIT/ML injection Commonly known as: NOVOLIN N Inject 15-20 Units into the skin See admin instructions. Inject 20 units in the morning and 15 units at night   lisinopril 40 MG tablet Commonly known as: ZESTRIL Take 40 mg by mouth daily.   Magnesium 400 MG Tabs Take 400-800 mg by mouth See admin instructions. Take 800 mg in the morning and 400 mg at night   methocarbamol 500 MG tablet Commonly known as: ROBAXIN Take 500-1,000 mg by mouth 2 (two) times daily as needed for muscle spasms.   MORPHINE SULFATE IV Inject into the vein. Pt has morphine pump   nystatin cream Commonly known as: MYCOSTATIN Apply 1 application topically 2 (two) times daily as needed for rash.   Omega-3 1000 MG Caps Take 1,000 mg by mouth 2 (two) times daily.   pantoprazole 40 MG tablet Commonly known as: PROTONIX Take 40 mg by mouth 2 (two) times daily.   predniSONE 2.5 MG tablet Commonly known as: DELTASONE Take 2.5 mg by mouth daily.   promethazine 12.5 MG tablet Commonly known as: PHENERGAN Take 12.5 mg by mouth every 6 (six) hours as needed for nausea/vomiting.   Promethegan 25 MG suppository Generic drug: promethazine Place 25 mg rectally every 6 (six) hours as needed for nausea/vomiting.   rizatriptan 10 MG tablet Commonly known as: MAXALT Take 10 mg by mouth as needed for migraine. May repeat in 2 hours if needed   simvastatin 40 MG tablet Commonly known as: ZOCOR Take 40 mg by mouth at bedtime.   tiZANidine 4 MG tablet Commonly known as: ZANAFLEX Take 12 mg  by mouth at bedtime.   Trulicity 0.75 MG/0.5ML Sopn Generic drug: Dulaglutide Inject 0.75 mg into the skin every Saturday.   vitamin C 1000 MG tablet Take 1,000 mg by mouth daily.   Vitamin D 50 MCG (2000 UT) tablet Take 4,000 Units by mouth daily.               Durable Medical Equipment  (From admission, onward)           Start     Ordered   05/25/21 1422  DME 3 n 1  Once  05/25/21 1421   05/25/21 1422  DME Walker rolling  Once       Question Answer Comment  Walker: With 5 Inch Wheels   Patient needs a walker to treat with the following condition Status post right knee replacement      05/25/21 1421            Diagnostic Studies: DG Knee Right Port  Result Date: 05/25/2021 CLINICAL DATA:  Status post total knee arthroplasty EXAM: PORTABLE RIGHT KNEE - 1-2 VIEW COMPARISON:  None. FINDINGS: There is a right total knee arthroplasty without evidence of loosening or periprosthetic fracture. Expected soft tissue changes. Normal alignment. IMPRESSION: Right total knee arthroplasty without evidence of immediate hardware complication. Electronically Signed   By: Caprice Renshaw M.D.   On: 05/25/2021 13:06    Disposition: Discharge disposition: 03-Skilled Nursing Facility          Contact information for follow-up providers     Kathryne Hitch, MD Follow up in 2 week(s).   Specialty: Orthopedic Surgery Contact information: [address] [place] [address] [phone]              Contact information for after-discharge care     Destination     [facility] SN[facility] Preferred SNF .   Service: Skilled Nursing Contact information: [address] [place] [address] [phone]                      Signed: Kathryne Hitch 06/01/2021, 12:03 PM

## 2021-06-01 NOTE — TOC Transition Note (Signed)
Transition of Care [facility]) - CM/SW Discharge Note  Patient Details  Name: Brittany Morris MRN: [id number] Date of Birth: Mar 08, 1966  Transition of Care [facility]) CM/SW Contact:  Ewing Schlein, LCSW Phone Number: 06/01/2021, 2:43 PM  Clinical Narrative: Patient and daughter provided bed offers and selected [facility]. CSW confirmed bed availability with Inetta Fermo in admissions and patient can come today. CSW completed insurance authorization. Plan Berkley Harvey ID is: [id number] and the reference ID is: [id number]. Patient has been approved for 06/01/2021-06/05/2021. Discharge summary, discharge orders, and SNF transfer report faxed to facility in hub.  Medical necessity form done; PTAR scheduled. Discharge packet completed. RN updated. TOC signing off.  Final next level of care: Skilled Nursing Facility Barriers to Discharge: Barriers Resolved  Patient Goals and CMS Choice Patient states their goals for this hospitalization and ongoing recovery are:: Go to rehab before returning home CMS Medicare.gov Compare Post Acute Care list provided to:: Patient Represenative (must comment) Choice offered to / list presented to : Adult Children, Patient  Discharge Placement PASRR number recieved: 05/31/21      Patient chooses bed at: [facility] [place] Patient and family notified of of transfer: 06/01/21  Discharge Plan and Services Discharge Planning Services: CM Consult Post Acute Care Choice: Skilled Nursing Facility          DME Arranged: Dan Humphreys rolling DME Agency: [employer] Date DME Agency Contacted: 05/26/21 Time DME Agency Contacted: [phone] Representative spoke with at DME Agency: [facility] HH Arranged: PT HH Agency: [facility] Date [facility] Agency Contacted: 05/26/21 Time HH Agency Contacted: [phone] Representative spoke with at [facility] Agency: stacie  Readmission Risk Interventions No flowsheet data found.

## 2021-06-02 LAB — GLUCOSE, CAPILLARY: Glucose-Capillary: 126 mg/dL — ABNORMAL HIGH (ref 70–99)

## 2021-06-02 NOTE — Progress Notes (Signed)
Called report to nurse Morrie Sheldon.

## 2021-06-02 NOTE — Clinical Social Work Note (Signed)
Patient was unable to be admitted to [facility] for SNF yesterday as it would be a late admission with no RN on site. Updated discharge paperwork faxed to facility in hub. Patient will go to room 701 and the number for report is [phone]. RN will ask for 700 nursing hall RN. PTAR scheduled. TOC signing off.

## 2021-06-02 NOTE — Discharge Summary (Signed)
Patient ID: Brittany Morris MRN: [id number] DOB/AGE: 05/06/1966 55 y.o.  Admit date: 05/25/2021 Discharge date: 06/02/2021  Admission Diagnoses:  Principal Problem:   Unilateral primary osteoarthritis, right knee Active Problems:   Status post right knee replacement   Status post total right knee replacement   Discharge Diagnoses:  Same  Past Medical History:  Diagnosis Date   Arthritis    Complication of anesthesia    difficulty with breathing after and waking up   Diabetes mellitus without complication (HCC)    Headache    migraines   High cholesterol    Hypertension    Nerve pain    Pneumonia    Sarcoidosis     Surgeries: Procedure(s): RIGHT TOTAL KNEE ARTHROPLASTY on 05/25/2021   Consultants:   Discharged Condition: Improved  Hospital Course: Brittany Morris is an 55 y.o. female who was admitted 05/25/2021 for operative treatment ofUnilateral primary osteoarthritis, right knee. Patient has severe unremitting pain that affects sleep, daily activities, and work/hobbies. After pre-op clearance the patient was taken to the operating room on 05/25/2021 and underwent  Procedure(s): RIGHT TOTAL KNEE ARTHROPLASTY.    Patient was given perioperative antibiotics:  Anti-infectives (From admission, onward)    Start     Dose/Rate Route Frequency Ordered Stop   05/25/21 2200  vancomycin (VANCOREADY) IVPB 1500 mg/300 mL        1,500 mg 150 mL/hr over 120 Minutes Intravenous Every 12 hours 05/25/21 1421 05/26/21 0006   05/25/21 0645  ceFAZolin (ANCEF) IVPB 2g/100 mL premix        2 g 200 mL/hr over 30 Minutes Intravenous On call to O.R. 05/25/21 8850 05/25/21 0946   05/25/21 0642  vancomycin (VANCOREADY) IVPB 1500 mg/300 mL        1,500 mg 150 mL/hr over 120 Minutes Intravenous 60 min pre-op 05/25/21 0642 05/25/21 1004   05/05/21 0600  vancomycin (VANCOREADY) IVPB 1500 mg/300 mL  Status:  Discontinued        1,500 mg 150 mL/hr over 120 Minutes Intravenous  Once 05/04/21  0817 05/24/21 0716        Patient was given sequential compression devices, early ambulation, and chemoprophylaxis to prevent DVT.  Patient benefited maximally from hospital stay and there were no complications.    Recent vital signs: Patient Vitals for the past 24 hrs:  BP Temp Temp src Pulse Resp SpO2  06/02/21 0619 127/70 98.4 F (36.9 C) Oral 95 16 92 %  06/01/21 2136 121/73 98.3 F (36.8 C) Oral 87 16 91 %  06/01/21 1400 122/72 98.6 F (37 C) Oral 99 18 --  06/01/21 1102 140/77 98.8 F (37.1 C) Oral 81 18 --  06/01/21 0826 (!) 143/69 98.6 F (37 C) Oral 94 16 --     Recent laboratory studies: No results for input(s): WBC, HGB, HCT, PLT, NA, K, CL, CO2, BUN, CREATININE, GLUCOSE, INR, CALCIUM in the last 72 hours.  Invalid input(s): PT, 2   Discharge Medications:   Allergies as of 06/02/2021       Reactions   Baclofen    Dizziness   Ciprofloxacin Rash        Medication List     TAKE these medications    acetaminophen 500 MG tablet Commonly known as: TYLENOL Take 1,000 mg by mouth every 6 (six) hours as needed for moderate pain.   amLODipine 5 MG tablet Commonly known as: NORVASC Take 5 mg by mouth daily.   apixaban 2.5 MG Tabs tablet Commonly known as:  ELIQUIS Take 1 tablet (2.5 mg total) by mouth 2 (two) times daily.   budesonide 3 MG 24 hr capsule Commonly known as: ENTOCORT EC Take 6 mg by mouth daily.   CALCIUM 500 PO Take 500 mg by mouth 2 (two) times daily.   divalproex 500 MG DR tablet Commonly known as: DEPAKOTE Take 500 mg by mouth 2 (two) times daily.   DULoxetine 60 MG capsule Commonly known as: CYMBALTA Take 60 mg by mouth 2 (two) times daily.   empagliflozin 10 MG Tabs tablet Commonly known as: JARDIANCE Take 10 mg by mouth daily.   ferrous sulfate 325 (65 FE) MG tablet Take 325 mg by mouth daily.   furosemide 20 MG tablet Commonly known as: LASIX Take 20 mg by mouth every other day.   gabapentin 300 MG  capsule Commonly known as: NEURONTIN Take 900 mg by mouth 2 (two) times daily.   insulin lispro 100 UNIT/ML injection Commonly known as: HUMALOG Inject 5-8 Units into the skin 3 (three) times daily before meals.   insulin NPH Human 100 UNIT/ML injection Commonly known as: NOVOLIN N Inject 15-20 Units into the skin See admin instructions. Inject 20 units in the morning and 15 units at night   lisinopril 40 MG tablet Commonly known as: ZESTRIL Take 40 mg by mouth daily.   Magnesium 400 MG Tabs Take 400-800 mg by mouth See admin instructions. Take 800 mg in the morning and 400 mg at night   methocarbamol 500 MG tablet Commonly known as: ROBAXIN Take 500-1,000 mg by mouth 2 (two) times daily as needed for muscle spasms.   MORPHINE SULFATE IV Inject into the vein. Pt has morphine pump   nystatin cream Commonly known as: MYCOSTATIN Apply 1 application topically 2 (two) times daily as needed for rash.   Omega-3 1000 MG Caps Take 1,000 mg by mouth 2 (two) times daily.   oxyCODONE 5 MG immediate release tablet Commonly known as: Oxy IR/ROXICODONE Take 1-2 tablets (5-10 mg total) by mouth every 4 (four) hours as needed for severe pain. What changed:  how much to take when to take this   pantoprazole 40 MG tablet Commonly known as: PROTONIX Take 40 mg by mouth 2 (two) times daily.   predniSONE 2.5 MG tablet Commonly known as: DELTASONE Take 2.5 mg by mouth daily.   promethazine 12.5 MG tablet Commonly known as: PHENERGAN Take 12.5 mg by mouth every 6 (six) hours as needed for nausea/vomiting.   Promethegan 25 MG suppository Generic drug: promethazine Place 25 mg rectally every 6 (six) hours as needed for nausea/vomiting.   rizatriptan 10 MG tablet Commonly known as: MAXALT Take 10 mg by mouth as needed for migraine. May repeat in 2 hours if needed   simvastatin 40 MG tablet Commonly known as: ZOCOR Take 40 mg by mouth at bedtime.   tiZANidine 4 MG tablet Commonly  known as: ZANAFLEX Take 12 mg by mouth at bedtime.   Trulicity 0.75 MG/0.5ML Sopn Generic drug: Dulaglutide Inject 0.75 mg into the skin every Saturday.   vitamin C 1000 MG tablet Take 1,000 mg by mouth daily.   Vitamin D 50 MCG (2000 UT) tablet Take 4,000 Units by mouth daily.               Durable Medical Equipment  (From admission, onward)           Start     Ordered   05/25/21 1422  DME 3 n 1  Once  05/25/21 1421   05/25/21 1422  DME Walker rolling  Once       Question Answer Comment  Walker: With 5 Inch Wheels   Patient needs a walker to treat with the following condition Status post right knee replacement      05/25/21 1421            Diagnostic Studies: DG Knee Right Port  Result Date: 05/25/2021 CLINICAL DATA:  Status post total knee arthroplasty EXAM: PORTABLE RIGHT KNEE - 1-2 VIEW COMPARISON:  None. FINDINGS: There is a right total knee arthroplasty without evidence of loosening or periprosthetic fracture. Expected soft tissue changes. Normal alignment. IMPRESSION: Right total knee arthroplasty without evidence of immediate hardware complication. Electronically Signed   By: Caprice Renshaw M.D.   On: 05/25/2021 13:06    Disposition: Discharge disposition: 03-Skilled Nursing Facility          Contact information for follow-up providers     Kathryne Hitch, MD Follow up in 2 week(s).   Specialty: Orthopedic Surgery Contact information: [address] [place] [address] [phone]              Contact information for after-discharge care     Destination     [facility] SNF Preferred SNF .   Service: Skilled Nursing Contact information: [address] [place] [address] [phone]                      Signed: Kathryne Hitch 06/02/2021, 7:07 AM

## 2021-06-08 ENCOUNTER — Ambulatory Visit (INDEPENDENT_AMBULATORY_CARE_PROVIDER_SITE_OTHER): Payer: Medicare Other | Admitting: Physician Assistant

## 2021-06-08 DIAGNOSIS — Z96651 Presence of right artificial knee joint: Secondary | ICD-10-CM

## 2021-06-08 NOTE — Progress Notes (Signed)
HPI: Brittany Morris returns today 2 weeks status post right total knee arthroplasty.  She is overall doing well.  She is at a skilled facility.  She had some swelling of her leg and some redness seen at [facility] where Doppler was performed is negative for DVT.  She is on Chronic Eliquis.  She was also placed on Keflex 3 times daily.  She had no shortness of breath fevers chills or chest pain.  She does note some calf pain.  Physical exam: Right knee full extension flexion to approximately 87 degrees.  Surgical incisions healing well no signs of an infection or dehiscence.  Calf supple nontender.  Dorsiflexion plantarflexion right ankle intact.  Impression: Status post right total knee arthroplasty 05/25/2021  Plan: Staples removed Steri-Strips applied.  She will continue her Keflex and finish the complete prescription.  Regards to swelling she will use her compression hose elevation wiggling toes encouraged.  Follow-up with [place] in 1 month sooner if there is any questions concerns.  Questions were encouraged and answered

## 2021-06-12 NOTE — Telephone Encounter (Signed)
Please advise 

## 2021-06-13 ENCOUNTER — Other Ambulatory Visit: Payer: Self-pay

## 2021-06-13 ENCOUNTER — Ambulatory Visit (INDEPENDENT_AMBULATORY_CARE_PROVIDER_SITE_OTHER): Payer: Medicare Other | Admitting: Orthopaedic Surgery

## 2021-06-13 DIAGNOSIS — Z96651 Presence of right artificial knee joint: Secondary | ICD-10-CM

## 2021-06-13 NOTE — Progress Notes (Signed)
HPI: Ms. Racey returns almost 3 weeks status post right total knee arthroplasty she is here for wound check.  Concerns about redness.  She has been on Keflex.  She has been on also Eliquis postop.  No fevers or chills.  Physical exam: Right knee no signs of infection.  Normal postop warmth slight erythema due to hematoma.  Full extension flexion beyond 90 degrees.  Surgical incisions healing well no signs of infection or wound dehiscence.  Impression: Status post right total knee arthroplasty 05/25/2021  Plan: She will continue to work on range of motion and strengthening.  She will finish off her Eliquis and discontinue as she was on no anticoagulants prior to surgery.  Reassurance is given by Dr. Magnus Ivan and myself that the warmth and slight "redness "is normal and expected postop.  See her back in her regular schedule appointment sooner if there is any questions or concerns.  New Steri-Strips applied.  She will wash the area with antibacterial soap daily and work on scar tissue mobilization.

## 2021-06-19 ENCOUNTER — Other Ambulatory Visit: Payer: Self-pay

## 2021-06-19 ENCOUNTER — Other Ambulatory Visit: Payer: Self-pay | Admitting: Orthopaedic Surgery

## 2021-06-19 DIAGNOSIS — Z96651 Presence of right artificial knee joint: Secondary | ICD-10-CM

## 2021-06-19 MED ORDER — OXYCODONE HCL 5 MG PO TABS: 5.0000 mg | ORAL_TABLET | ORAL | 0 refills | Status: DC | PRN

## 2021-06-19 MED ORDER — OXYCODONE HCL 5 MG PO TABS: 10.0000 mg | ORAL_TABLET | ORAL | 0 refills | Status: DC | PRN

## 2021-06-19 NOTE — Telephone Encounter (Signed)
Any update from [facility]?

## 2021-06-19 NOTE — Telephone Encounter (Signed)
Message sent to [facility]

## 2021-07-06 ENCOUNTER — Ambulatory Visit (INDEPENDENT_AMBULATORY_CARE_PROVIDER_SITE_OTHER): Payer: Medicare Other | Admitting: Physician Assistant

## 2021-07-06 ENCOUNTER — Encounter: Payer: Self-pay | Admitting: Physician Assistant

## 2021-07-06 DIAGNOSIS — Z96651 Presence of right artificial knee joint: Secondary | ICD-10-CM

## 2021-07-06 NOTE — Progress Notes (Signed)
HPI: Brittany Morris returns today almost 6 weeks status post right total knee arthroplasty.  She is concerned about the flexion of her knee.  She still doing home therapy.   Her only other concern is the redness of the leg.  She states the leg is tender and that she is having really no pain in the knee.  Review of systems see HPI otherwise negative  Physical exam: Right knee she has full extension and flexion to 105 degrees.  No gross instability valgus varus stressing.  Surgical incisions well-healed.  Calf supple nontender.  No signs of infection about the knee.  Minimal edema right lower leg.  Impression: Status post right total knee arthroplasty 05/25/2021  Plan: We will send her to outpatient physical therapy prescriptions given.  They will work on strengthening and range of motion of the knee.  Recommend use of compression hose during the day.  Scar tissue mobilization encouraged.  We will see her back in 1 month sooner if there is any questions concerns.

## 2021-11-01 ENCOUNTER — Ambulatory Visit
Admission: RE | Admit: 2021-11-01 | Discharge: 2021-11-01 | Disposition: A | Discharge status: Home / Self Care | Payer: Medicare Other | Source: Ambulatory Visit | Attending: Chiropractor | Admitting: Chiropractor

## 2021-11-01 ENCOUNTER — Ambulatory Visit
Admission: RE | Admit: 2021-11-01 | Discharge: 2021-11-01 | Disposition: A | Discharge status: Home / Self Care | Payer: Medicare Other | Attending: Chiropractor | Admitting: Chiropractor

## 2021-11-01 ENCOUNTER — Other Ambulatory Visit: Payer: Self-pay | Admitting: Chiropractor

## 2021-11-01 DIAGNOSIS — S29012A Strain of muscle and tendon of back wall of thorax, initial encounter: Secondary | ICD-10-CM | POA: Insufficient documentation

## 2021-11-01 DIAGNOSIS — S39012A Strain of muscle, fascia and tendon of lower back, initial encounter: Secondary | ICD-10-CM | POA: Insufficient documentation

## 2021-11-01 DIAGNOSIS — S299XXA Unspecified injury of thorax, initial encounter: Secondary | ICD-10-CM | POA: Insufficient documentation

## 2021-11-01 DIAGNOSIS — Y92410 Unspecified street and highway as the place of occurrence of the external cause: Secondary | ICD-10-CM | POA: Diagnosis not present

## 2021-11-01 DIAGNOSIS — S161XXA Strain of muscle, fascia and tendon at neck level, initial encounter: Secondary | ICD-10-CM | POA: Insufficient documentation

## 2021-11-01 DIAGNOSIS — Z96651 Presence of right artificial knee joint: Secondary | ICD-10-CM | POA: Diagnosis not present

## 2021-11-01 DIAGNOSIS — M25569 Pain in unspecified knee: Secondary | ICD-10-CM | POA: Insufficient documentation

## 2021-11-01 DIAGNOSIS — M1712 Unilateral primary osteoarthritis, left knee: Secondary | ICD-10-CM | POA: Insufficient documentation

## 2021-11-01 DIAGNOSIS — M549 Dorsalgia, unspecified: Secondary | ICD-10-CM | POA: Diagnosis not present

## 2022-04-05 DIAGNOSIS — E2740 Unspecified adrenocortical insufficiency: Secondary | ICD-10-CM | POA: Insufficient documentation

## 2022-10-09 HISTORY — PX: TOTAL KNEE ARTHROPLASTY: SHX125

## 2022-11-27 DIAGNOSIS — M25661 Stiffness of right knee, not elsewhere classified: Secondary | ICD-10-CM | POA: Insufficient documentation

## 2023-03-19 IMAGING — DX DG KNEE 1-2V PORT*R*
2 series · 2 of 2 positions shown · non-contrast
Comparison: None.

CLINICAL DATA: Status post total knee arthroplasty

EXAM:
PORTABLE RIGHT KNEE - 1-2 VIEW

[knee ap]
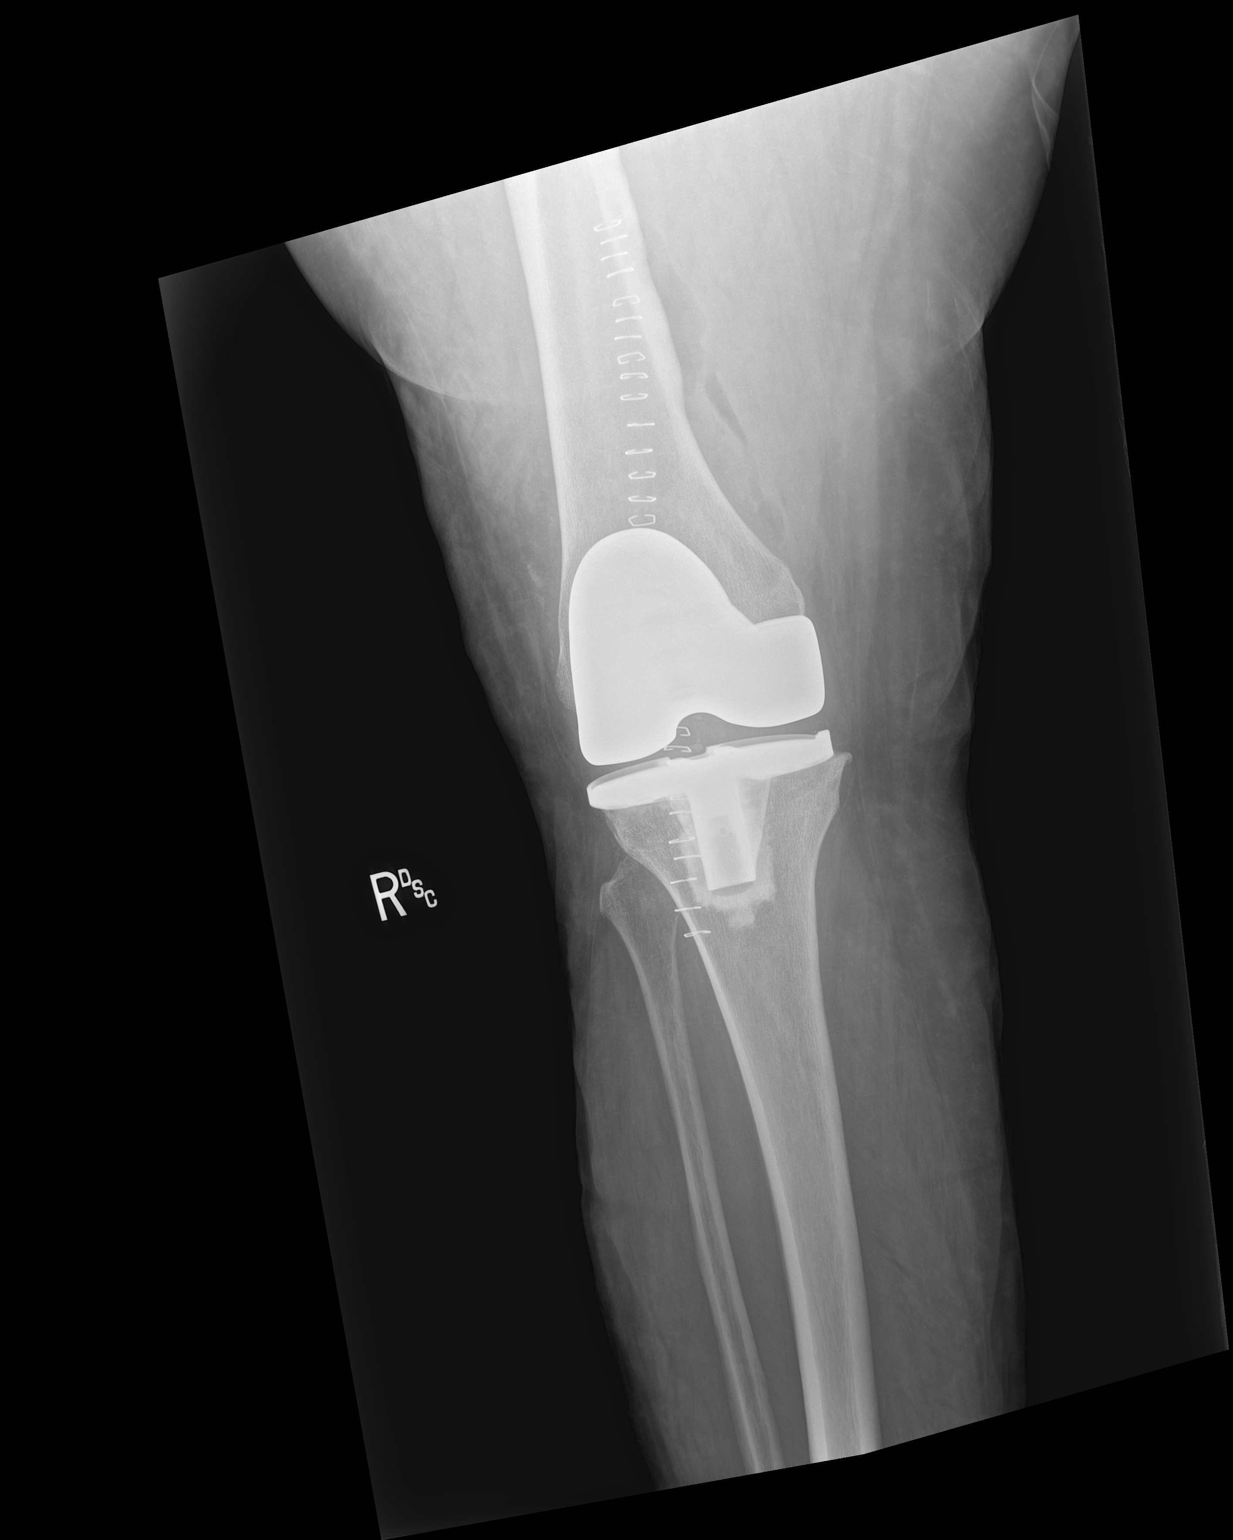

[knee lat]
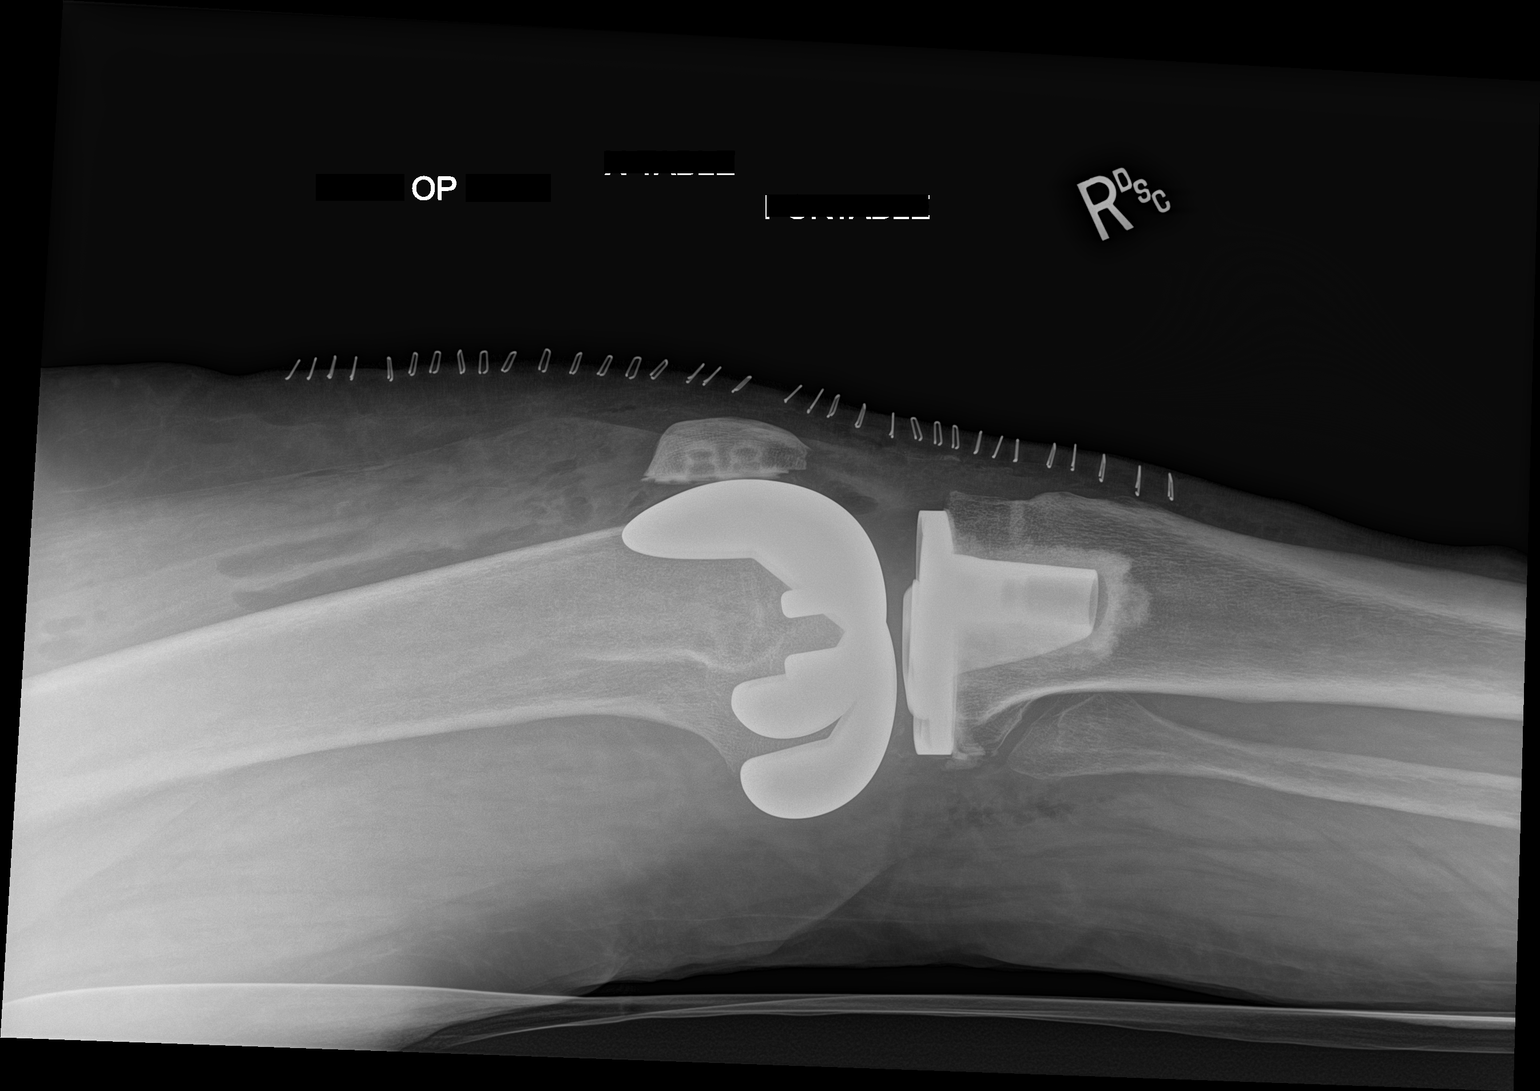

[2 of 2 positions shown; findings below may reference images not displayed]

FINDINGS: There is a right total knee arthroplasty without evidence of
loosening or periprosthetic fracture. Expected soft tissue changes.
Normal alignment.
IMPRESSION: Right total knee arthroplasty without evidence of immediate hardware
complication.

## 2023-10-17 ENCOUNTER — Telehealth: Payer: Self-pay | Admitting: Neurosurgery

## 2023-10-17 NOTE — Telephone Encounter (Signed)
 Patient is requesting an appointment with Dr. Myer Haff. She states that her daughter has seen him and would like a second opinion for herself. She had previous back surgery at [facility] in February 2022, just had an MRI this week at [facility], and has had several injections and is having another one today. Please advise if okay to schedule with Dr. Myer Haff.   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

## 2023-10-17 NOTE — Telephone Encounter (Signed)
 [employer] request sent to [facility] MRI [facility] 10/15/23: MRI thoracic and MRI lumbar  02/16/22: MRI lumbar and MRI pelvis  01/19/22: xray lumbar at [facility] [place]

## 2023-10-18 NOTE — Telephone Encounter (Signed)
 Still waiting on images from [facility]

## 2023-10-18 NOTE — Telephone Encounter (Signed)
 Sent another request to [facility]

## 2023-10-21 ENCOUNTER — Inpatient Hospital Stay
Admission: RE | Admit: 2023-10-21 | Discharge: 2023-10-21 | Disposition: A | Discharge status: Home / Self Care | Payer: Self-pay | Source: Ambulatory Visit | Attending: Neurosurgery | Admitting: Neurosurgery

## 2023-10-21 ENCOUNTER — Other Ambulatory Visit: Payer: Self-pay

## 2023-10-21 DIAGNOSIS — Z049 Encounter for examination and observation for unspecified reason: Secondary | ICD-10-CM

## 2023-10-21 NOTE — Telephone Encounter (Signed)
 Pt is currently seeing Dr Sandie Ano and receiving injections at [facility]. She is requesting to see Dr Myer Haff for a 2nd opinion (referred by her daughter). I do not see mention/record of recent physical therapy. I will forward to Dr Myer Haff for review.

## 2023-10-21 NOTE — Telephone Encounter (Signed)
 Still haven't received all images. Sent another request to [facility]

## 2023-10-21 NOTE — Telephone Encounter (Addendum)
 Images have been received from [facility] and loaded to pt's chart. Reports are in Care Everywhere.

## 2023-10-24 NOTE — Telephone Encounter (Signed)
10/31/2023 
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
## 2023-10-31 ENCOUNTER — Ambulatory Visit: Admitting: Neurosurgery

## 2023-10-31 VITALS — BP 120/78 | Ht 66.0 in | Wt 203.0 lb

## 2023-10-31 DIAGNOSIS — G894 Chronic pain syndrome: Secondary | ICD-10-CM

## 2023-10-31 DIAGNOSIS — M5126 Other intervertebral disc displacement, lumbar region: Secondary | ICD-10-CM | POA: Diagnosis not present

## 2023-10-31 DIAGNOSIS — M5416 Radiculopathy, lumbar region: Secondary | ICD-10-CM

## 2023-12-23 DIAGNOSIS — M5126 Other intervertebral disc displacement, lumbar region: Secondary | ICD-10-CM | POA: Insufficient documentation

## 2024-01-02 ENCOUNTER — Ambulatory Visit: Admitting: Neurosurgery

## 2024-01-23 ENCOUNTER — Encounter: Payer: Self-pay | Admitting: Neurosurgery

## 2024-01-23 ENCOUNTER — Ambulatory Visit: Admitting: Neurosurgery

## 2024-01-23 VITALS — BP 142/90 | Ht 66.0 in | Wt 203.0 lb

## 2024-01-23 DIAGNOSIS — M5416 Radiculopathy, lumbar region: Secondary | ICD-10-CM

## 2024-01-23 DIAGNOSIS — G894 Chronic pain syndrome: Secondary | ICD-10-CM

## 2024-01-23 DIAGNOSIS — M5126 Other intervertebral disc displacement, lumbar region: Secondary | ICD-10-CM | POA: Diagnosis not present

## 2024-01-23 DIAGNOSIS — R29898 Other symptoms and signs involving the musculoskeletal system: Secondary | ICD-10-CM

## 2024-01-23 DIAGNOSIS — Z90710 Acquired absence of both cervix and uterus: Secondary | ICD-10-CM | POA: Insufficient documentation
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
## 2024-01-23 NOTE — Addendum Note (Signed)
 Addended by: Terrius Gentile on: 01/23/2024 05:40 PM   Modules accepted: Orders

## 2024-01-23 NOTE — Patient Instructions (Signed)
 Please see below for information in regards to your upcoming surgery:   Planned surgery: L3-4 Decompression and Recurrent Left L4-5 discectomy    Surgery date: 03/23/2024  at [facility] (Medical Mall: [address], [place], [place] [address]) - you will find out your arrival time the business day before your surgery.   Pre-op appointment at [facility] Pre-admit Testing: you will receive a call with a date/time for this appointment. If you are scheduled for an in person appointment, Pre-admit Testing is located on the first floor of the [facility] building, [address], [address]. During this appointment, they will advise you which medications you can take the morning of surgery, and which medications you will need to hold for surgery. Labs (such as blood work, EKG) may be done at your pre-op appointment. You are not required to fast for these labs. Should you need to change your pre-op appointment, please call Pre-admit testing at [phone].    Diabetes medications: Per anesthesia guidelines (due to the increased risk of aspiration caused by delayed gastric emptying): GLP-1 medications:  Orals should be held x 1 day Injectables should be held x 7 days  Semaglutide [employer], SKIP 8/3 DOSE, LAST DOSE 7/27)  SGLT2 inhibitors should be held for 3 days prior to surgery  Empagliflozin  (Jardiance ) 3 days prior, Resume after surgery  Surgical clearance: we will send a clearance form to Pamelia Boehringer, MD and Mayer Speaker, MD. They may wish to see you in their office prior to signing the clearance form. If so, they may call you to schedule an appointment.  Common restrictions after surgery: No bending, lifting, or twisting ("BLT"). Avoid lifting objects heavier than 10 pounds for the first 6 weeks after surgery. Where possible, avoid household activities that involve lifting, bending, reaching, pushing, or pulling such as laundry, vacuuming,  grocery shopping, and childcare. Try to arrange for help from friends and family for these activities while you heal. Do not drive while taking prescription pain medication. Weeks 6 through 12 after surgery: avoid lifting more than 25 pounds.    How to contact us :  If you have any questions/concerns before or after surgery, you can reach us  at [phone], or you can send a mychart message. We can be reached by phone or mychart 8am-4pm, Monday-Friday.  *Please note: Calls after 4pm are forwarded to a third party answering service. Mychart messages are not routinely monitored during evenings, weekends, and holidays. Please call our office to contact the answering service for urgent concerns during non-business hours.   Appointments/FMLA & disability paperwork: Gerlean Kocher, & Maryann Smalls Registered Nurses/Surgery schedulers: Kendelyn & Rocko Fesperman Medical Assistants: Donnajean Fuse Physician Assistants: Ludwig Safer, PA-C, Anastacio Karvonen, PA-C & Lucetta Russel, PA-C Surgeons: Jodeen Munch, MD & Henderson Lock, MD   [facility] PREADMIT TESTING VISIT and SURGERY INFORMATION SHEET   Now that surgery has been scheduled you can anticipate several phone calls from [facility] services. A pharmacy technician will call you to verify your current list of medications taken at home.               The Pre-Service Center will call to verify your insurance information and to give you billing estimates and information.             The Preadmit Testing Office will be calling to schedule a visit to obtain information for the anesthesia team and provide instructions on preparation for surgery.  What can  you expect for the Preadmit Testing Visit: Appointments may be scheduled in-person or by telephone.  If a telephone visit is scheduled, you may be asked to come into the office to have lab tests or other studies performed.   This visit will not be completed any greater than 14 days prior to your  surgery.  If your surgery has been scheduled for a future date, please do not be alarmed if we have not contacted you to schedule an appointment more than a month prior to the surgery date.    Please be prepared to provide the following information during this appointment:            -Personal medical history                                               -Medication and allergy list            -Any history of problems with anesthesia              -Recent lab work or diagnostic studies            -Please notify us  of any needs we should be aware of to provide the best care possible           -You will be provided with instructions on how to prepare for your surgery.    On The Day of Surgery:  You must have a driver to take you home after surgery, you will be asked not to drive for 24 hours following surgery.  Taxi, [employer] and non-medical transport will not be acceptable means of transportation unless you have a responsible individual who will be traveling with you.  Visitors in the surgical area:   2 people will be able to visit you in your room once your preparation for surgery has been completed. During surgery, your visitors will be asked to wait in the Surgery Waiting Area.  It is not a requirement for them to stay, if they prefer to leave and come back.  Your visitor(s) will be given an update once the surgery has been completed.  No visitors are allowed in the initial recovery room to respect patient privacy and safety.  Once you are more awake and transfer to the secondary recovery area, or are transferred to an inpatient room, visitors will again be able to see you.  To respect and protect your privacy: We will ask on the day of surgery who your driver will be and what the contact number for that individual will be. We will ask if it is okay to share information with this individual, or if there is an alternative individual that we, or the surgeon, should contact to provide updates and  information. If family or friends come to the surgical information desk requesting information about you, who you have not listed with us , no information will be given.   It may be helpful to designate someone as the main contact who will be responsible for updating your other friends and family.    PREADMIT TESTING OFFICE: [phone] SAME DAY SURGERY: [phone] We look forward to caring for you before and throughout the process of your surgery.

## 2024-02-03 ENCOUNTER — Encounter: Payer: Self-pay | Admitting: Neurosurgery

## 2024-02-27 ENCOUNTER — Encounter: Payer: Self-pay | Admitting: Neurosurgery

## 2024-03-03 ENCOUNTER — Ambulatory Visit: Admitting: Neurosurgery

## 2024-03-06 ENCOUNTER — Ambulatory Visit: Admitting: Neurosurgery

## 2024-03-09 ENCOUNTER — Encounter: Payer: Self-pay | Admitting: Neurosurgery

## 2024-03-10 ENCOUNTER — Other Ambulatory Visit: Payer: Self-pay

## 2024-03-10 ENCOUNTER — Encounter
Admission: RE | Admit: 2024-03-10 | Discharge: 2024-03-10 | Disposition: A | Discharge status: Home / Self Care | Source: Ambulatory Visit | Attending: Neurosurgery | Admitting: Neurosurgery

## 2024-03-10 DIAGNOSIS — Z01818 Encounter for other preprocedural examination: Secondary | ICD-10-CM | POA: Diagnosis present

## 2024-03-10 DIAGNOSIS — Z01812 Encounter for preprocedural laboratory examination: Secondary | ICD-10-CM

## 2024-03-10 DIAGNOSIS — E119 Type 2 diabetes mellitus without complications: Secondary | ICD-10-CM | POA: Insufficient documentation

## 2024-03-10 DIAGNOSIS — E2740 Unspecified adrenocortical insufficiency: Secondary | ICD-10-CM | POA: Insufficient documentation

## 2024-03-10 DIAGNOSIS — Z22322 Carrier or suspected carrier of Methicillin resistant Staphylococcus aureus: Secondary | ICD-10-CM

## 2024-03-10 DIAGNOSIS — Z01810 Encounter for preprocedural cardiovascular examination: Secondary | ICD-10-CM | POA: Diagnosis not present

## 2024-03-10 DIAGNOSIS — I159 Secondary hypertension, unspecified: Secondary | ICD-10-CM | POA: Diagnosis not present

## 2024-03-10 DIAGNOSIS — R9431 Abnormal electrocardiogram [ECG] [EKG]: Secondary | ICD-10-CM | POA: Insufficient documentation

## 2024-03-10 HISTORY — DX: Type 2 diabetes mellitus without complications: Z79.4

## 2024-03-10 HISTORY — DX: Endometriosis, unspecified: N80.9

## 2024-03-10 HISTORY — DX: Other adrenocortical insufficiency: E27.49

## 2024-03-10 HISTORY — DX: Obstructive sleep apnea (adult) (pediatric): G47.33

## 2024-03-10 HISTORY — DX: Long term (current) use of systemic steroids: Z79.52

## 2024-03-10 HISTORY — DX: Type 2 diabetes mellitus without complications: E11.9

## 2024-03-10 HISTORY — DX: Other intervertebral disc displacement, lumbar region: M51.26

## 2024-03-10 HISTORY — DX: Other injury of unspecified body region, initial encounter: T14.8XXA

## 2024-03-10 HISTORY — DX: Hyperlipidemia, unspecified: E78.5

## 2024-03-10 HISTORY — DX: Other specified mononeuropathies of unspecified lower limb: G57.80

## 2024-03-10 HISTORY — DX: Migraine, unspecified, not intractable, without status migrainosus: G43.909

## 2024-03-10 HISTORY — DX: Personal history of urinary calculi: Z87.442

## 2024-03-10 LAB — CBC
HCT: 43.2 % (ref 36.0–46.0)
Hemoglobin: 14.1 g/dL (ref 12.0–15.0)
MCH: 30.6 pg (ref 26.0–34.0)
MCHC: 32.6 g/dL (ref 30.0–36.0)
MCV: 93.7 fL (ref 80.0–100.0)
Platelets: 161 K/uL (ref 150–400)
RBC: 4.61 MIL/uL (ref 3.87–5.11)
RDW: 13 % (ref 11.5–15.5)
WBC: 5.8 K/uL (ref 4.0–10.5)
nRBC: 0 % (ref 0.0–0.2)

## 2024-03-10 LAB — BASIC METABOLIC PANEL WITH GFR
Anion gap: 12 (ref 5–15)
BUN: 6 mg/dL (ref 6–20)
CO2: 29 mmol/L (ref 22–32)
Calcium: 8.9 mg/dL (ref 8.9–10.3)
Chloride: 96 mmol/L — ABNORMAL LOW (ref 98–111)
Creatinine, Ser: 0.71 mg/dL (ref 0.44–1.00)
GFR, Estimated: >60 mL/min (ref >60)
Glucose, Bld: 221 mg/dL — ABNORMAL HIGH (ref 70–99)
Potassium: 3.7 mmol/L (ref 3.5–5.1)
Sodium: 137 mmol/L (ref 135–145)

## 2024-03-10 LAB — TYPE AND SCREEN
ABO/RH(D): O POS
Antibody Screen: NEGATIVE

## 2024-03-10 LAB — SURGICAL PCR SCREEN
MRSA, PCR: POSITIVE — AB
Staphylococcus aureus: POSITIVE — AB

## 2024-03-10 MED ORDER — MUPIROCIN 2 % EX OINT: TOPICAL_OINTMENT | CUTANEOUS | 0 refills | Status: DC

## 2024-03-10 NOTE — Patient Instructions (Addendum)
 Your procedure is scheduled on: Monday, August 4 Report to the Registration Desk on the 1st floor of the [facility] To find out your arrival time, please call [phone] between 1PM - 3PM on: Friday, August 1 If your arrival time is 6:00 am, do not arrive before that time as the Medical Mall entrance doors do not open until 6:00 am.  REMEMBER: Instructions that are not followed completely may result in serious medical risk, up to and including death; or upon the discretion of your surgeon and anesthesiologist your surgery may need to be rescheduled.  Do not eat food after midnight the night before surgery.  No gum chewing or hard candies.  You may however, drink water  up to 2 hours before you are scheduled to arrive for your surgery. Do not drink anything within 2 hours of your scheduled arrival time.  One week prior to surgery: starting July 28 Stop ANY OVER THE COUNTER supplements until after surgery. Stop vitamin C, calcium, omega 3.  You may however, continue to take Tylenol  if needed for pain up until the day of surgery.  Ozempic (Semaglutide) - hold for 7 days before surgery. Do NOT take Ozempic on Sunday, August 3. Last day to take it is Sunday July 27. Resume AFTER surgery on your regular weekly day.  Jardiance  - hold for 3 days before surgery. Last day to take is Thursday, July 31. Resume AFTER surgery.  Continue taking all of your other prescription medications up until the day of surgery.  ON THE DAY OF SURGERY ONLY TAKE THESE MEDICATIONS WITH SIPS OF WATER :  divalproex  (DEPAKOTE )  DULoxetine  (CYMBALTA )  gabapentin  (NEURONTIN )  pantoprazole  (PROTONIX )  predniSONE  (DELTASONE )  primidone (MYSOLINE)   Do NOT take any insulin  on the morning of surgery.  No Alcohol for 24 hours before or after surgery.  No Smoking including e-cigarettes for 24 hours before surgery.  No chewable tobacco products for at least 6 hours before surgery.  No nicotine patches on the day of  surgery.  Do not use any recreational drugs for at least a week (preferably 2 weeks) before your surgery.  Please be advised that the combination of cocaine and anesthesia may have negative outcomes, up to and including death. If you test positive for cocaine, your surgery will be cancelled.  On the morning of surgery brush your teeth with toothpaste and water , you may rinse your mouth with mouthwash if you wish. Do not swallow any toothpaste or mouthwash.  Use CHG Soap as directed on instruction sheet.  Do not wear jewelry, make-up, hairpins, clips or nail polish.  For welded (permanent) jewelry: bracelets, anklets, waist bands, etc.  Please have this removed prior to surgery.  If it is not removed, there is a chance that hospital personnel will need to cut it off on the day of surgery.  Do not wear lotions, powders, or perfumes.   Do not shave body hair from the neck down 48 hours before surgery.  Contact lenses, hearing aids and dentures may not be worn into surgery.  Do not bring valuables to the hospital. [facility] is not responsible for any missing/lost belongings or valuables.   Notify your doctor if there is any change in your medical condition (cold, fever, infection).  Wear comfortable clothing (specific to your surgery type) to the hospital.  After surgery, you can help prevent lung complications by doing breathing exercises.  Take deep breaths and cough every 1-2 hours. Your doctor may order a device called  an Facilities manager to help you take deep breaths.  If you are being admitted to the hospital overnight, leave your suitcase in the car. After surgery it may be brought to your room.  In case of increased patient census, it may be necessary for you, the patient, to continue your postoperative care in the Same Day Surgery department.  If you are being discharged the day of surgery, you will not be allowed to drive home. You will need a responsible individual  to drive you home and stay with you for 24 hours after surgery.   If you are taking public transportation, you will need to have a responsible individual with you.  Please call the Pre-admissions Testing Dept. at [phone] if you have any questions about these instructions.  Surgery Visitation Policy:  Patients having surgery or a procedure may have two visitors.  Children under the age of 3 must have an adult with them who is not the patient.  Inpatient Visitation:    Visiting hours are 7 a.m. to 8 p.m. Up to four visitors are allowed at one time in a patient room. The visitors may rotate out with other people during the day.  One visitor age 42 or older may stay with the patient overnight and must be in the room by 8 p.m.   Merchandiser, retail to address health-related social needs:  [email or web address]      Pre-operative 5 CHG Bath Instructions   You can play a key role in reducing the risk of infection after surgery. Your skin needs to be as free of germs as possible. You can reduce the number of germs on your skin by washing with CHG (chlorhexidine  gluconate) soap before surgery. CHG is an antiseptic soap that kills germs and continues to kill germs even after washing.   DO NOT use if you have an allergy to chlorhexidine /CHG or antibacterial soaps. If your skin becomes reddened or irritated, stop using the CHG and notify one of our RNs at [phone].   Please shower with the CHG soap starting 4 days before surgery using the following schedule:     Please keep in mind the following:  DO NOT shave, including legs and underarms, starting the day of your first shower.   You may shave your face at any point before/day of surgery.  Place clean sheets on your bed the day you start using CHG soap. Use a clean washcloth (not used since being washed) for each shower. DO NOT sleep with pets once you start using the CHG.   CHG Shower Instructions:  If  you choose to wash your hair and private area, wash first with your normal shampoo/soap.  After you use shampoo/soap, rinse your hair and body thoroughly to remove shampoo/soap residue.  Turn the water  OFF and apply about 3 tablespoons (45 ml) of CHG soap to a CLEAN washcloth.  Apply CHG soap ONLY FROM YOUR NECK DOWN TO YOUR TOES (washing for 3-5 minutes)  DO NOT use CHG soap on face, private areas, open wounds, or sores.  Pay special attention to the area where your surgery is being performed.  If you are having back surgery, having someone wash your back for you may be helpful. Wait 2 minutes after CHG soap is applied, then you may rinse off the CHG soap.  Pat dry with a clean towel  Put on clean clothes/pajamas   If you choose to wear lotion, please use ONLY the CHG-compatible lotions on the back of  this paper.     Additional instructions for the day of surgery: DO NOT APPLY any lotions, deodorants, cologne, or perfumes.   Put on clean/comfortable clothes.  Brush your teeth.  Ask your nurse before applying any prescription medications to the skin.      CHG Compatible Lotions   Aveeno Moisturizing lotion  Cetaphil Moisturizing Cream  Cetaphil Moisturizing Lotion  Clairol Herbal Essence Moisturizing Lotion, Dry Skin  Clairol Herbal Essence Moisturizing Lotion, Extra Dry Skin  Clairol Herbal Essence Moisturizing Lotion, Normal Skin  Curel Age Defying Therapeutic Moisturizing Lotion with Alpha Hydroxy  Curel Extreme Care Body Lotion  Curel Soothing Hands Moisturizing Hand Lotion  Curel Therapeutic Moisturizing Cream, Fragrance-Free  Curel Therapeutic Moisturizing Lotion, Fragrance-Free  Curel Therapeutic Moisturizing Lotion, Original Formula  Eucerin Daily Replenishing Lotion  Eucerin Dry Skin Therapy Plus Alpha Hydroxy Crme  Eucerin Dry Skin Therapy Plus Alpha Hydroxy Lotion  Eucerin Original Crme  Eucerin Original Lotion  Eucerin Plus Crme Eucerin Plus Lotion  Eucerin  TriLipid Replenishing Lotion  Keri Anti-Bacterial Hand Lotion  Keri Deep Conditioning Original Lotion Dry Skin Formula Softly Scented  Keri Deep Conditioning Original Lotion, Fragrance Free Sensitive Skin Formula  Keri Lotion Fast Absorbing Fragrance Free Sensitive Skin Formula  Keri Lotion Fast Absorbing Softly Scented Dry Skin Formula  Keri Original Lotion  Keri Skin Renewal Lotion Keri Silky Smooth Lotion  Keri Silky Smooth Sensitive Skin Lotion  Nivea Body Creamy Conditioning Oil  Nivea Body Extra Enriched Teacher, adult education Moisturizing Lotion Nivea Crme  Nivea Skin Firming Lotion  NutraDerm 30 Skin Lotion  NutraDerm Skin Lotion  NutraDerm Therapeutic Skin Cream  NutraDerm Therapeutic Skin Lotion  ProShield Protective Hand Cream  Provon moisturizing lotion

## 2024-03-10 NOTE — Progress Notes (Signed)
  Perioperative Services  Abnormal Lab Notification and Treatment Plan of Care  Date: 03/10/24  Name: Brittany Morris DOB: April 24, 1966 MRN:   [id number]  Re: Abnormal labs noted during PAT appointment  Provider Notified: No att. providers found Notification mode: Routed and/or faxed via CHL  Labs of concern: Lab Results  Component Value Date   STAPHAUREUS POSITIVE (A) 03/10/2024   MRSAPCR POSITIVE (A) 03/10/2024    Notes: Patient is scheduled for a LUMBAR LAMINECTOMY/DECOMPRESSION MICRODISCECTOMY 2 LEVELS on 03/23/2024. She is scheduled to receive CEFAZOLIN  pre-operatively. Pre-surgical PCR (+) for MRSA; see above.  PLANS:  Review renal function. Estimated Creatinine Clearance: 92 mL/min (by C-G formula based on SCr of 0.71 mg/dL).  Review allergies. No documented allergy to vancomycin .  Order added for VANCOMYCIN  1 GRAM IV to current preoperative prophylactic regimen.   Patient with orders for both CEFAZOLIN  + VANCOMYCIN  to be given in the setting of documented MRSA (+) surgical PCR.   Guidelines suggest that a beta-lactam antibiotic (first or second generation cephalosporin) should be added for activity against gram-negative organisms.  Vancomycin  appears to be less effective than cefazolin  for preventing SSIs caused by MSSA. For this reason, the use of vancomycin  in combination with cefazolin  is favored for prevention of SSI due to MRSA and coagulase-negative staphylococci.  Medical history in CHL updated to reflect (+) PCR result indicating nasal MRSA colonization   Rx for additional preoperative nasal decolonization sent to patient's pharmacy of record.   Meds ordered this encounter  Medications   mupirocin  ointment (BACTROBAN ) 2 %    Sig: Apply small amount to the inside of both nostrils TWICE a day for the next 5 days.    Dispense:  15 g    Refill:  0    Please contact the patient as soon as it is available for pickup. Rx is for preoperative nasal decolonization  following (+) MRSA result on PCR testing. Needs to be started ASAP.   Dorise Pereyra, MSN, APRN, FNP-C, CEN [facility]  Perioperative Services Nurse Practitioner Phone: [phone] 03/10/24 4:24 PM

## 2024-03-11 NOTE — Progress Notes (Addendum)
 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 Fret Clois, MD.  In review of patient's past medical history it is noted that she has a history of secondary adrenal insufficiency.  Condition is related to chronic use of corticosteroids that are being used to treat her sarcoidosis.  Patient is followed by endocrinology.  In review of the notes from the patient's specialist, they specifically note that patient is on long-term corticosteroid therapy (prednisone  2.5 mg daily) with no plans for discontinuation.  With that said, endocrinology is aware of patient's upcoming procedure with Dr. Katrina.  Recommendations for periprocedural steroids have been received as follows:  Solu-Cortef 100 mg IV prior to anesthesia  Solu-Cortef 50 mg IV every 6 hours x 24 hours  Increase prednisone  to 5 mg daily x 3-4 days after procedure, then reduce back down to 2.5 mg daily once feeling better.  Discussed the above with primary attending surgeon and anesthesiologist on-call Abe, MD).  The physician providers are aware of patient's diagnoses and  recommendations from her endocrinologist; will plan to implement as per above. Discussed orders. I am not sure if the unit can see orders that are entered in the SDS encounters. To ensure that orders are available and ready to be carried as planned, Gregory, PA-C advised that she would enter the orders on the day of surgery. I did add a nursing communication order to advise staff to remind patient about the increase in her normal prednisone  dose following her procedure. They were asked to provide both a verbal review and to also include information on her discharge instructions for patient review and documentation for us  that she received the information.   There were also changes made to the regimen being used to manage her T2DM. Goal is to optimize her non-insulin  therapies and get her off of bolus insulin  dosing all together. She was started on oral metformin. Additionally, she was advised to stop taking her NPH and Novolog  insulins. Again, being aware of upcoming surgery, she was advised that she could use her Novolog   postoperatively for few days in order to help control the anticipated/probable steroid induced hyperglycemia. Please note, during her PAT appointment, patient advised staff that she was not going to start taking the metformin that was recommended, as it had caused her to experience bad diarrhea in the past. She was asked to make her endocrinologist aware that further adjustments could be made if needed.   Appreciate endocrinology input regarding perioperative management of this mutual patient. Patient has all of the above information on her AVS from her last visit with  them on 03/02/2024. Office advising that if there are any questions or concerns from either the patient or the interdisciplinary team surgical team, they can be contacted for further assistance/input; Real Sprung, MD (resident) and Stanton Sage, MD (attending) at [phone].  Dorise Pereyra, MSN, APRN, FNP-C, CEN [facility]  Perioperative Services Nurse Practitioner Phone: [phone] Fax: [phone] 03/11/24 1:53 PM  NOTE: This note has been prepared using Dragon dictation software. Despite my best ability to proofread, there is always the potential that unintentional transcriptional errors may still occur from this process.

## 2024-03-22 MED ORDER — VANCOMYCIN HCL IN DEXTROSE 1-5 GM/200ML-% IV SOLN
1000.0000 mg | Freq: Once | INTRAVENOUS | Status: AC
  Administered 2024-03-23: 1000 mg via INTRAVENOUS

## 2024-03-22 MED ORDER — CEFAZOLIN IN SODIUM CHLORIDE 2-0.9 GM/100ML-% IV SOLN
2.0000 g | Freq: Once | INTRAVENOUS | Status: DC
  Filled 2024-03-22: qty 100

## 2024-03-22 MED ORDER — SODIUM CHLORIDE 0.9 % IV SOLN: INTRAVENOUS | Status: DC

## 2024-03-22 MED ORDER — ORAL CARE MOUTH RINSE: 15.0000 mL | Freq: Once | OROMUCOSAL | Status: AC

## 2024-03-22 MED ORDER — CEFAZOLIN SODIUM-DEXTROSE 2-4 GM/100ML-% IV SOLN
2.0000 g | INTRAVENOUS | Status: AC
  Administered 2024-03-23: 2 g via INTRAVENOUS

## 2024-03-22 MED ORDER — CHLORHEXIDINE GLUCONATE 0.12 % MT SOLN
15.0000 mL | Freq: Once | OROMUCOSAL | Status: AC
  Administered 2024-03-23: 15 mL via OROMUCOSAL

## 2024-03-23 ENCOUNTER — Ambulatory Visit: Payer: Self-pay | Admitting: Urgent Care

## 2024-03-23 ENCOUNTER — Other Ambulatory Visit: Payer: Self-pay

## 2024-03-23 ENCOUNTER — Ambulatory Visit

## 2024-03-23 ENCOUNTER — Encounter
Admission: RE | Disposition: A | Discharge status: Home / Self Care | Payer: Self-pay | Source: Home / Self Care | Attending: Neurosurgery

## 2024-03-23 ENCOUNTER — Observation Stay
Admission: RE | Admit: 2024-03-23 | Discharge: 2024-03-24 | Disposition: A | Discharge status: Home / Self Care | Attending: Neurosurgery | Admitting: Neurosurgery

## 2024-03-23 ENCOUNTER — Encounter: Payer: Self-pay | Admitting: Neurosurgery

## 2024-03-23 DIAGNOSIS — M48061 Spinal stenosis, lumbar region without neurogenic claudication: Secondary | ICD-10-CM | POA: Diagnosis present

## 2024-03-23 DIAGNOSIS — Z87891 Personal history of nicotine dependence: Secondary | ICD-10-CM | POA: Diagnosis not present

## 2024-03-23 DIAGNOSIS — I159 Secondary hypertension, unspecified: Secondary | ICD-10-CM

## 2024-03-23 DIAGNOSIS — M5416 Radiculopathy, lumbar region: Secondary | ICD-10-CM

## 2024-03-23 DIAGNOSIS — M5126 Other intervertebral disc displacement, lumbar region: Secondary | ICD-10-CM | POA: Diagnosis not present

## 2024-03-23 DIAGNOSIS — E2740 Unspecified adrenocortical insufficiency: Secondary | ICD-10-CM

## 2024-03-23 DIAGNOSIS — Z01810 Encounter for preprocedural cardiovascular examination: Secondary | ICD-10-CM

## 2024-03-23 DIAGNOSIS — M5116 Intervertebral disc disorders with radiculopathy, lumbar region: Secondary | ICD-10-CM | POA: Insufficient documentation

## 2024-03-23 DIAGNOSIS — E119 Type 2 diabetes mellitus without complications: Secondary | ICD-10-CM

## 2024-03-23 DIAGNOSIS — R29898 Other symptoms and signs involving the musculoskeletal system: Secondary | ICD-10-CM | POA: Diagnosis not present

## 2024-03-23 DIAGNOSIS — Z01812 Encounter for preprocedural laboratory examination: Principal | ICD-10-CM

## 2024-03-23 HISTORY — PX: REPAIR OF CEREBROSPINAL FLUID LEAK: SHX6322

## 2024-03-23 HISTORY — PX: LUMBAR LAMINECTOMY/DECOMPRESSION MICRODISCECTOMY: SHX5026

## 2024-03-23 LAB — GLUCOSE, CAPILLARY
Glucose-Capillary: 105 mg/dL — ABNORMAL HIGH (ref 70–99)
Glucose-Capillary: 136 mg/dL — ABNORMAL HIGH (ref 70–99)
Glucose-Capillary: 146 mg/dL — ABNORMAL HIGH (ref 70–99)
Glucose-Capillary: 202 mg/dL — ABNORMAL HIGH (ref 70–99)

## 2024-03-23 SURGERY — LUMBAR LAMINECTOMY/DECOMPRESSION MICRODISCECTOMY 2 LEVELS
Anesthesia: General | Site: Spine Lumbar

## 2024-03-23 MED ORDER — PROPOFOL 10 MG/ML IV BOLUS
INTRAVENOUS | Status: AC
  Filled 2024-03-23: qty 20

## 2024-03-23 MED ORDER — HYDROCORTISONE SOD SUC (PF) 100 MG IJ SOLR
INTRAMUSCULAR | Status: AC
Start: 2024-03-23 — End: 2024-03-23
  Filled 2024-03-23: qty 2

## 2024-03-23 MED ORDER — DIPHENHYDRAMINE HCL 50 MG/ML IJ SOLN
INTRAMUSCULAR | Status: DC | PRN
  Administered 2024-03-23: 12.5 mg via INTRAVENOUS

## 2024-03-23 MED ORDER — OXYCODONE HCL 5 MG/5ML PO SOLN: 5.0000 mg | Freq: Once | ORAL | Status: AC | PRN

## 2024-03-23 MED ORDER — PHENYLEPHRINE 80 MCG/ML (10ML) SYRINGE FOR IV PUSH (FOR BLOOD PRESSURE SUPPORT)
PREFILLED_SYRINGE | INTRAVENOUS | Status: AC
  Filled 2024-03-23: qty 10

## 2024-03-23 MED ORDER — METHYLPREDNISOLONE SODIUM SUCC 40 MG IJ SOLR
INTRAMUSCULAR | Status: AC
  Filled 2024-03-23: qty 1

## 2024-03-23 MED ORDER — LIDOCAINE HCL (PF) 2 % IJ SOLN
INTRAMUSCULAR | Status: AC
  Filled 2024-03-23: qty 5

## 2024-03-23 MED ORDER — MORPHINE SULFATE (PF) 2 MG/ML IV SOLN: 2.0000 mg | INTRAVENOUS | Status: AC | PRN

## 2024-03-23 MED ORDER — PROPOFOL 10 MG/ML IV BOLUS
INTRAVENOUS | Status: DC | PRN
  Administered 2024-03-23: 160 mg via INTRAVENOUS

## 2024-03-23 MED ORDER — INSULIN ASPART 100 UNIT/ML IJ SOLN
0.0000 [IU] | Freq: Three times a day (TID) | INTRAMUSCULAR | Status: DC
  Administered 2024-03-24: 4 [IU] via SUBCUTANEOUS
  Filled 2024-03-23: qty 1

## 2024-03-23 MED ORDER — PRIMIDONE 50 MG PO TABS
50.0000 mg | ORAL_TABLET | Freq: Two times a day (BID) | ORAL | Status: DC
Start: 2024-03-23 — End: 2024-03-24
  Administered 2024-03-23 – 2024-03-24 (×2): 50 mg via ORAL
  Filled 2024-03-23 (×3): qty 1

## 2024-03-23 MED ORDER — ENOXAPARIN SODIUM 40 MG/0.4ML IJ SOSY
40.0000 mg | PREFILLED_SYRINGE | INTRAMUSCULAR | Status: DC
  Administered 2024-03-24: 40 mg via SUBCUTANEOUS
  Filled 2024-03-23: qty 0.4

## 2024-03-23 MED ORDER — OXYCODONE HCL 5 MG PO TABS
ORAL_TABLET | ORAL | Status: AC
Start: 2024-03-23 — End: 2024-03-23
  Filled 2024-03-23: qty 1

## 2024-03-23 MED ORDER — METHYLPREDNISOLONE SODIUM SUCC 125 MG IJ SOLR
INTRAMUSCULAR | Status: AC
  Filled 2024-03-23: qty 2

## 2024-03-23 MED ORDER — SIMVASTATIN 20 MG PO TABS
40.0000 mg | ORAL_TABLET | Freq: Every day | ORAL | Status: DC
  Administered 2024-03-23: 40 mg via ORAL
  Filled 2024-03-23: qty 2

## 2024-03-23 MED ORDER — PHENOL 1.4 % MT LIQD: 1.0000 | OROMUCOSAL | Status: DC | PRN

## 2024-03-23 MED ORDER — METHOCARBAMOL 500 MG PO TABS
500.0000 mg | ORAL_TABLET | Freq: Four times a day (QID) | ORAL | Status: DC | PRN
  Administered 2024-03-23: 500 mg via ORAL
  Filled 2024-03-23: qty 1

## 2024-03-23 MED ORDER — DOCUSATE SODIUM 100 MG PO CAPS
100.0000 mg | ORAL_CAPSULE | Freq: Two times a day (BID) | ORAL | Status: DC
  Administered 2024-03-23 – 2024-03-24 (×2): 100 mg via ORAL
  Filled 2024-03-23 (×2): qty 1

## 2024-03-23 MED ORDER — ONDANSETRON HCL 4 MG/2ML IJ SOLN: 4.0000 mg | Freq: Four times a day (QID) | INTRAMUSCULAR | Status: DC | PRN

## 2024-03-23 MED ORDER — SODIUM CHLORIDE 0.9% FLUSH
3.0000 mL | Freq: Two times a day (BID) | INTRAVENOUS | Status: DC
  Administered 2024-03-23 – 2024-03-24 (×2): 3 mL via INTRAVENOUS

## 2024-03-23 MED ORDER — GABAPENTIN 100 MG PO CAPS
600.0000 mg | ORAL_CAPSULE | Freq: Two times a day (BID) | ORAL | Status: DC
  Administered 2024-03-23 – 2024-03-24 (×2): 600 mg via ORAL
  Filled 2024-03-23 (×2): qty 2

## 2024-03-23 MED ORDER — FERROUS SULFATE 325 (65 FE) MG PO TABS
325.0000 mg | ORAL_TABLET | Freq: Every day | ORAL | Status: DC
  Administered 2024-03-24: 325 mg via ORAL
  Filled 2024-03-23: qty 1

## 2024-03-23 MED ORDER — MAGNESIUM OXIDE -MG SUPPLEMENT 400 (240 MG) MG PO TABS
800.0000 mg | ORAL_TABLET | Freq: Every day | ORAL | Status: DC
  Administered 2024-03-24: 800 mg via ORAL
  Filled 2024-03-23: qty 2

## 2024-03-23 MED ORDER — CHLORHEXIDINE GLUCONATE 0.12 % MT SOLN
OROMUCOSAL | Status: AC
Start: 2024-03-23 — End: 2024-03-23
  Filled 2024-03-23: qty 15

## 2024-03-23 MED ORDER — PANTOPRAZOLE SODIUM 40 MG PO TBEC
40.0000 mg | DELAYED_RELEASE_TABLET | Freq: Two times a day (BID) | ORAL | Status: DC
  Administered 2024-03-23 – 2024-03-24 (×3): 40 mg via ORAL
  Filled 2024-03-23 (×3): qty 1

## 2024-03-23 MED ORDER — SODIUM CHLORIDE (PF) 0.9 % IJ SOLN
INTRAMUSCULAR | Status: AC
  Filled 2024-03-23: qty 20

## 2024-03-23 MED ORDER — BUPIVACAINE HCL (PF) 0.5 % IJ SOLN
INTRAMUSCULAR | Status: AC
  Filled 2024-03-23: qty 30

## 2024-03-23 MED ORDER — LISINOPRIL 20 MG PO TABS
40.0000 mg | ORAL_TABLET | Freq: Every day | ORAL | Status: DC
  Administered 2024-03-23: 40 mg via ORAL

## 2024-03-23 MED ORDER — ACETAMINOPHEN 500 MG PO TABS
1000.0000 mg | ORAL_TABLET | Freq: Four times a day (QID) | ORAL | Status: DC
  Administered 2024-03-23 – 2024-03-24 (×3): 1000 mg via ORAL
  Filled 2024-03-23 (×3): qty 2

## 2024-03-23 MED ORDER — SENNA 8.6 MG PO TABS
1.0000 | ORAL_TABLET | Freq: Two times a day (BID) | ORAL | Status: DC
  Administered 2024-03-23 – 2024-03-24 (×2): 8.6 mg via ORAL
  Filled 2024-03-23 (×2): qty 1

## 2024-03-23 MED ORDER — 0.9 % SODIUM CHLORIDE (POUR BTL) OPTIME
TOPICAL | Status: DC | PRN
Start: 2024-03-23 — End: 2024-03-23
  Administered 2024-03-23 (×2): 500 mL

## 2024-03-23 MED ORDER — SODIUM CHLORIDE 0.9% FLUSH: 3.0000 mL | INTRAVENOUS | Status: DC | PRN

## 2024-03-23 MED ORDER — BUPIVACAINE-EPINEPHRINE (PF) 0.5% -1:200000 IJ SOLN
INTRAMUSCULAR | Status: AC
  Filled 2024-03-23: qty 30

## 2024-03-23 MED ORDER — PHENYLEPHRINE HCL-NACL 20-0.9 MG/250ML-% IV SOLN
INTRAVENOUS | Status: DC | PRN
  Administered 2024-03-23: 20 ug/min via INTRAVENOUS

## 2024-03-23 MED ORDER — OXYCODONE HCL 5 MG PO TABS
5.0000 mg | ORAL_TABLET | Freq: Once | ORAL | Status: AC | PRN
  Administered 2024-03-23: 5 mg via ORAL

## 2024-03-23 MED ORDER — OXYCODONE HCL 5 MG PO TABS
5.0000 mg | ORAL_TABLET | ORAL | Status: DC | PRN
  Administered 2024-03-23 – 2024-03-24 (×3): 5 mg via ORAL
  Filled 2024-03-23 (×4): qty 1

## 2024-03-23 MED ORDER — FIBRIN SEALANT 2 ML SINGLE DOSE KIT
PACK | CUTANEOUS | Status: DC | PRN
  Administered 2024-03-23: 2 mL via TOPICAL

## 2024-03-23 MED ORDER — MENTHOL 3 MG MT LOZG: 1.0000 | LOZENGE | OROMUCOSAL | Status: DC | PRN

## 2024-03-23 MED ORDER — PHENYLEPHRINE 80 MCG/ML (10ML) SYRINGE FOR IV PUSH (FOR BLOOD PRESSURE SUPPORT)
PREFILLED_SYRINGE | INTRAVENOUS | Status: AC
Start: 2024-03-23 — End: 2024-03-23
  Filled 2024-03-23: qty 10

## 2024-03-23 MED ORDER — ACETAMINOPHEN 10 MG/ML IV SOLN
INTRAVENOUS | Status: DC | PRN
  Administered 2024-03-23: 1000 mg via INTRAVENOUS

## 2024-03-23 MED ORDER — CEFAZOLIN SODIUM-DEXTROSE 2-4 GM/100ML-% IV SOLN
INTRAVENOUS | Status: AC
Start: 2024-03-23 — End: 2024-03-23
  Filled 2024-03-23: qty 100

## 2024-03-23 MED ORDER — METHYLPREDNISOLONE ACETATE 40 MG/ML IJ SUSP
INTRAMUSCULAR | Status: AC
  Filled 2024-03-23: qty 1

## 2024-03-23 MED ORDER — BUPIVACAINE LIPOSOME 1.3 % IJ SUSP
INTRAMUSCULAR | Status: AC
  Filled 2024-03-23: qty 20

## 2024-03-23 MED ORDER — ONDANSETRON HCL 4 MG/2ML IJ SOLN
INTRAMUSCULAR | Status: DC | PRN
  Administered 2024-03-23: 4 mg via INTRAVENOUS

## 2024-03-23 MED ORDER — CHLORHEXIDINE GLUCONATE 4 % EX SOLN: 1.0000 | CUTANEOUS | 1 refills | Status: DC

## 2024-03-23 MED ORDER — FENTANYL CITRATE (PF) 100 MCG/2ML IJ SOLN
INTRAMUSCULAR | Status: AC
  Filled 2024-03-23: qty 2

## 2024-03-23 MED ORDER — ONDANSETRON HCL 4 MG/2ML IJ SOLN
INTRAMUSCULAR | Status: AC
  Filled 2024-03-23: qty 2

## 2024-03-23 MED ORDER — VANCOMYCIN HCL IN DEXTROSE 1-5 GM/200ML-% IV SOLN
INTRAVENOUS | Status: AC
Start: 2024-03-23 — End: 2024-03-23
  Filled 2024-03-23: qty 200

## 2024-03-23 MED ORDER — HYDROCORTISONE SOD SUC (PF) 100 MG IJ SOLR
INTRAMUSCULAR | Status: AC
  Filled 2024-03-23: qty 2

## 2024-03-23 MED ORDER — DIPHENHYDRAMINE HCL 50 MG/ML IJ SOLN
INTRAMUSCULAR | Status: AC
  Filled 2024-03-23: qty 1

## 2024-03-23 MED ORDER — LIDOCAINE HCL (CARDIAC) PF 100 MG/5ML IV SOSY
PREFILLED_SYRINGE | INTRAVENOUS | Status: DC | PRN
  Administered 2024-03-23: 80 mg via INTRAVENOUS

## 2024-03-23 MED ORDER — FENTANYL CITRATE (PF) 100 MCG/2ML IJ SOLN
INTRAMUSCULAR | Status: AC
Start: 2024-03-23 — End: 2024-03-23
  Filled 2024-03-23: qty 2

## 2024-03-23 MED ORDER — DIVALPROEX SODIUM 500 MG PO DR TAB
500.0000 mg | DELAYED_RELEASE_TABLET | Freq: Two times a day (BID) | ORAL | Status: DC
  Administered 2024-03-23 – 2024-03-24 (×2): 500 mg via ORAL
  Filled 2024-03-23 (×2): qty 1

## 2024-03-23 MED ORDER — MIDAZOLAM HCL 5 MG/5ML IJ SOLN
INTRAMUSCULAR | Status: DC | PRN
  Administered 2024-03-23: 2 mg via INTRAVENOUS

## 2024-03-23 MED ORDER — EMPAGLIFLOZIN 10 MG PO TABS
10.0000 mg | ORAL_TABLET | Freq: Every day | ORAL | Status: DC
  Administered 2024-03-23 – 2024-03-24 (×2): 10 mg via ORAL
  Filled 2024-03-23 (×2): qty 1

## 2024-03-23 MED ORDER — HYDROCORTISONE SOD SUC (PF) 100 MG IJ SOLR
INTRAMUSCULAR | Status: DC | PRN
  Administered 2024-03-23: 100 mg via INTRAVENOUS

## 2024-03-23 MED ORDER — HYDROCORTISONE SOD SUC (PF) 100 MG IJ SOLR
50.0000 mg | Freq: Four times a day (QID) | INTRAMUSCULAR | Status: AC
  Administered 2024-03-23 – 2024-03-24 (×4): 50 mg via INTRAVENOUS
  Filled 2024-03-23: qty 1
  Filled 2024-03-23: qty 2

## 2024-03-23 MED ORDER — KETAMINE HCL 50 MG/5ML IJ SOSY
PREFILLED_SYRINGE | INTRAMUSCULAR | Status: DC | PRN
  Administered 2024-03-23: 10 mg via INTRAVENOUS
  Administered 2024-03-23: 20 mg via INTRAVENOUS

## 2024-03-23 MED ORDER — ACETAMINOPHEN 650 MG RE SUPP: 650.0000 mg | RECTAL | Status: DC | PRN

## 2024-03-23 MED ORDER — OXYCODONE HCL 5 MG PO TABS
10.0000 mg | ORAL_TABLET | ORAL | Status: DC | PRN
  Administered 2024-03-23: 10 mg via ORAL
  Filled 2024-03-23: qty 2

## 2024-03-23 MED ORDER — FENTANYL CITRATE (PF) 100 MCG/2ML IJ SOLN
25.0000 ug | INTRAMUSCULAR | Status: DC | PRN
  Administered 2024-03-23: 25 ug via INTRAVENOUS
  Administered 2024-03-23 (×2): 50 ug via INTRAVENOUS

## 2024-03-23 MED ORDER — GLYCOPYRROLATE PF 0.2 MG/ML IJ SOSY
PREFILLED_SYRINGE | INTRAMUSCULAR | Status: DC | PRN
  Administered 2024-03-23 (×2): .1 mg via INTRAVENOUS

## 2024-03-23 MED ORDER — ACETAMINOPHEN 325 MG PO TABS: 650.0000 mg | ORAL_TABLET | ORAL | Status: DC | PRN

## 2024-03-23 MED ORDER — LISINOPRIL 20 MG PO TABS
ORAL_TABLET | ORAL | Status: AC
Start: 2024-03-23 — End: 2024-03-23
  Filled 2024-03-23: qty 2

## 2024-03-23 MED ORDER — MIDAZOLAM HCL 2 MG/2ML IJ SOLN
INTRAMUSCULAR | Status: AC
  Filled 2024-03-23: qty 2

## 2024-03-23 MED ORDER — SORBITOL 70 % SOLN: 30.0000 mL | Freq: Every day | Status: DC | PRN

## 2024-03-23 MED ORDER — DULOXETINE HCL 30 MG PO CPEP
60.0000 mg | ORAL_CAPSULE | Freq: Two times a day (BID) | ORAL | Status: DC
  Administered 2024-03-23 – 2024-03-24 (×2): 60 mg via ORAL
  Filled 2024-03-23 (×2): qty 2

## 2024-03-23 MED ORDER — ACETAMINOPHEN 10 MG/ML IV SOLN
INTRAVENOUS | Status: AC
  Filled 2024-03-23: qty 100

## 2024-03-23 MED ORDER — MAGNESIUM CITRATE PO SOLN: 1.0000 | Freq: Once | ORAL | Status: DC | PRN

## 2024-03-23 MED ORDER — SODIUM CHLORIDE (PF) 0.9 % IJ SOLN
INTRAMUSCULAR | Status: DC | PRN
  Administered 2024-03-23: 60 mL

## 2024-03-23 MED ORDER — METHOCARBAMOL 1000 MG/10ML IJ SOLN
500.0000 mg | Freq: Four times a day (QID) | INTRAMUSCULAR | Status: DC | PRN
  Administered 2024-03-23: 500 mg via INTRAVENOUS
  Filled 2024-03-23: qty 10

## 2024-03-23 MED ORDER — PHENYLEPHRINE 80 MCG/ML (10ML) SYRINGE FOR IV PUSH (FOR BLOOD PRESSURE SUPPORT)
PREFILLED_SYRINGE | INTRAVENOUS | Status: DC | PRN
  Administered 2024-03-23 (×2): 80 ug via INTRAVENOUS

## 2024-03-23 MED ORDER — PHENYLEPHRINE HCL-NACL 20-0.9 MG/250ML-% IV SOLN
INTRAVENOUS | Status: AC
Start: 2024-03-23 — End: 2024-03-23
  Filled 2024-03-23: qty 250

## 2024-03-23 MED ORDER — SODIUM CHLORIDE 0.9 % IV SOLN
250.0000 mL | INTRAVENOUS | Status: DC
  Administered 2024-03-23: 250 mL via INTRAVENOUS

## 2024-03-23 MED ORDER — MUPIROCIN 2 % EX OINT: 1.0000 | TOPICAL_OINTMENT | Freq: Two times a day (BID) | CUTANEOUS | 0 refills | Status: DC

## 2024-03-23 MED ORDER — DEXAMETHASONE SODIUM PHOSPHATE 10 MG/ML IJ SOLN
INTRAMUSCULAR | Status: AC
Start: 2024-03-23 — End: 2024-03-23
  Filled 2024-03-23: qty 1

## 2024-03-23 MED ORDER — SURGIFLO WITH THROMBIN (HEMOSTATIC MATRIX KIT) OPTIME
TOPICAL | Status: DC | PRN
  Administered 2024-03-23: 1 via TOPICAL

## 2024-03-23 MED ORDER — MUPIROCIN 2 % EX OINT
TOPICAL_OINTMENT | Freq: Two times a day (BID) | CUTANEOUS | Status: DC
  Administered 2024-03-23: 1 via TOPICAL
  Filled 2024-03-23: qty 22

## 2024-03-23 MED ORDER — INSULIN GLARGINE-YFGN 100 UNIT/ML ~~LOC~~ SOLN
15.0000 [IU] | Freq: Every day | SUBCUTANEOUS | Status: DC
  Administered 2024-03-23 – 2024-03-24 (×2): 15 [IU] via SUBCUTANEOUS
  Filled 2024-03-23 (×2): qty 0.15

## 2024-03-23 MED ORDER — ONDANSETRON HCL 4 MG PO TABS: 4.0000 mg | ORAL_TABLET | Freq: Four times a day (QID) | ORAL | Status: DC | PRN

## 2024-03-23 MED ORDER — KETAMINE HCL 50 MG/5ML IJ SOSY
PREFILLED_SYRINGE | INTRAMUSCULAR | Status: AC
  Filled 2024-03-23: qty 5

## 2024-03-23 MED ORDER — KETOROLAC TROMETHAMINE 15 MG/ML IJ SOLN
15.0000 mg | Freq: Four times a day (QID) | INTRAMUSCULAR | Status: AC
  Administered 2024-03-23 – 2024-03-24 (×4): 15 mg via INTRAVENOUS
  Filled 2024-03-23 (×4): qty 1

## 2024-03-23 MED ORDER — BUPIVACAINE-EPINEPHRINE (PF) 0.5% -1:200000 IJ SOLN
INTRAMUSCULAR | Status: DC | PRN
Start: 2024-03-23 — End: 2024-03-23
  Administered 2024-03-23: 10 mL via PERINEURAL

## 2024-03-23 MED ORDER — SUCCINYLCHOLINE CHLORIDE 200 MG/10ML IV SOSY
PREFILLED_SYRINGE | INTRAVENOUS | Status: DC | PRN
  Administered 2024-03-23: 100 mg via INTRAVENOUS

## 2024-03-23 MED ORDER — FENTANYL CITRATE (PF) 100 MCG/2ML IJ SOLN
INTRAMUSCULAR | Status: DC | PRN
  Administered 2024-03-23: 25 ug via INTRAVENOUS

## 2024-03-23 MED ORDER — POLYETHYLENE GLYCOL 3350 17 G PO PACK: 17.0000 g | PACK | Freq: Every day | ORAL | Status: DC | PRN

## 2024-03-23 MED ORDER — FUROSEMIDE 20 MG PO TABS
20.0000 mg | ORAL_TABLET | Freq: Every day | ORAL | Status: DC
Start: 2024-03-23 — End: 2024-03-24
  Administered 2024-03-23 – 2024-03-24 (×2): 20 mg via ORAL
  Filled 2024-03-23 (×2): qty 1

## 2024-03-23 MED ORDER — INSULIN ASPART 100 UNIT/ML IJ SOLN
0.0000 [IU] | Freq: Every day | INTRAMUSCULAR | Status: DC
  Administered 2024-03-23: 2 [IU] via SUBCUTANEOUS
  Filled 2024-03-23: qty 1

## 2024-03-23 MED ORDER — METHYLPREDNISOLONE ACETATE 40 MG/ML IJ SUSP
INTRAMUSCULAR | Status: DC | PRN
Start: 2024-03-23 — End: 2024-03-23
  Administered 2024-03-23: 20 mg

## 2024-03-23 MED ORDER — PROPOFOL 1000 MG/100ML IV EMUL
INTRAVENOUS | Status: AC
  Filled 2024-03-23: qty 100

## 2024-03-23 MED ORDER — MAGNESIUM OXIDE -MG SUPPLEMENT 400 (240 MG) MG PO TABS
400.0000 mg | ORAL_TABLET | Freq: Every day | ORAL | Status: DC
  Administered 2024-03-23: 400 mg via ORAL
  Filled 2024-03-23 (×2): qty 1

## 2024-03-23 MED ORDER — PREDNISONE 10 MG PO TABS
5.0000 mg | ORAL_TABLET | Freq: Every day | ORAL | Status: DC
  Administered 2024-03-24: 5 mg via ORAL
  Filled 2024-03-23: qty 1

## 2024-03-23 MED ORDER — SUCCINYLCHOLINE CHLORIDE 200 MG/10ML IV SOSY
PREFILLED_SYRINGE | INTRAVENOUS | Status: AC
  Filled 2024-03-23: qty 10

## 2024-03-23 SURGICAL SUPPLY — 33 items
BASIN KIT SINGLE STR (MISCELLANEOUS) ×2 IMPLANT
BUR NEURO DRILL SOFT 3.0X3.8M (BURR) ×2 IMPLANT
CORD BIP STRL DISP 12FT (MISCELLANEOUS) IMPLANT
DERMABOND ADVANCED .7 DNX12 (GAUZE/BANDAGES/DRESSINGS) ×2 IMPLANT
DRAPE C ARM PK CFD 31 SPINE (DRAPES) ×2 IMPLANT
DRAPE LAPAROTOMY 100X77 ABD (DRAPES) ×2 IMPLANT
DRAPE MICROSCOPE SPINE 48X150 (DRAPES) ×2 IMPLANT
DRSG OPSITE POSTOP 3X4 (GAUZE/BANDAGES/DRESSINGS) ×2 IMPLANT
DRSG OPSITE POSTOP 4X6 (GAUZE/BANDAGES/DRESSINGS) IMPLANT
ELECTRODE EZSTD 165MM 6.5IN (MISCELLANEOUS) ×2 IMPLANT
ELECTRODE REM PT RTRN 9FT ADLT (ELECTROSURGICAL) ×2 IMPLANT
GLOVE BIOGEL PI IND STRL 6.5 (GLOVE) ×2 IMPLANT
GLOVE SURG SYN 6.5 PF PI (GLOVE) ×2 IMPLANT
GLOVE SURG SYN 8.5 PF PI (GLOVE) ×6 IMPLANT
GOWN SRG LRG LVL 4 IMPRV REINF (GOWNS) ×2 IMPLANT
GOWN SRG XL LVL 3 NONREINFORCE (GOWNS) ×2 IMPLANT
GRAFT DURAGEN MATRIX 1WX1L (Tissue) IMPLANT
KIT SPINAL PRONEVIEW (KITS) ×2 IMPLANT
MANIFOLD NEPTUNE II (INSTRUMENTS) ×2 IMPLANT
NDL SAFETY ECLIPSE 18X1.5 (NEEDLE) ×2 IMPLANT
NS IRRIG 500ML POUR BTL (IV SOLUTION) ×2 IMPLANT
PACK LAMINECTOMY ARMC (PACKS) ×2 IMPLANT
PAD ARMBOARD POSITIONER FOAM (MISCELLANEOUS) ×2 IMPLANT
SURGIFLO W/THROMBIN 8M KIT (HEMOSTASIS) ×2 IMPLANT
SUT STRATA 3-0 15 PS-2 (SUTURE) ×2 IMPLANT
SUT VIC AB 0 CT1 27XCR 8 STRN (SUTURE) ×2 IMPLANT
SUT VIC AB 2-0 CT1 18 (SUTURE) ×2 IMPLANT
SUTURE EHLN 3-0 FS-10 30 BLK (SUTURE) IMPLANT
SYR 30ML LL (SYRINGE) ×4 IMPLANT
SYR 3ML LL SCALE MARK (SYRINGE) ×2 IMPLANT
TOWEL OR 17X24 6PK STRL BLUE (TOWEL DISPOSABLE) IMPLANT
TRAP FLUID SMOKE EVACUATOR (MISCELLANEOUS) ×2 IMPLANT
TRAY FOLEY SLVR 16FR LF STAT (SET/KITS/TRAYS/PACK) IMPLANT

## 2024-03-23 NOTE — Anesthesia Postprocedure Evaluation (Signed)
 Anesthesia Post Note  Patient: Brittany Morris  Procedure(s) Performed: LUMBAR LAMINECTOMY/DECOMPRESSION MICRODISCECTOMY 2 LEVELS REPAIR OF CEREBROSPINAL FLUID LEAK (Spine Lumbar)  Patient location during evaluation: PACU Anesthesia Type: General Level of consciousness: awake and alert Pain management: pain level controlled Vital Signs Assessment: post-procedure vital signs reviewed and stable Respiratory status: spontaneous breathing, nonlabored ventilation, respiratory function stable and patient connected to nasal cannula oxygen Cardiovascular status: blood pressure returned to baseline and stable Postop Assessment: no apparent nausea or vomiting Anesthetic complications: no   There were no known notable events for this encounter.   Last Vitals:  Vitals:   03/23/24 1150 03/23/24 1200  BP:  103/60  Pulse: 74 74  Resp: 19 14  Temp:    SpO2: 93% 92%    Last Pain:  Vitals:   03/23/24 1200  TempSrc:   PainSc: Asleep                 [place]

## 2024-03-23 NOTE — Op Note (Signed)
 Indications: Ms. 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 The patient tried and failed conservative management, prompting surgical intervention.  Findings: severely scarred L4/5 level  Preoperative Diagnosis: Lumbar radiculopathy (ICD-10 M54.16) Postoperative Diagnosis: same   EBL: 25 ml IVF: see anesthesia record Drains: none Disposition: Extubated and Stable to PACU Complications: extensive scarring - small durotomy  A foley catheter was placed at the end of the case.   Preoperative Note:   Risks of surgery discussed include: infection, bleeding, stroke, coma, death, paralysis, CSF leak, nerve/spinal cord injury, numbness, tingling, weakness, complex regional pain syndrome, recurrent stenosis and/or disc herniation, vascular injury, development of instability, neck/back pain, need for further surgery, persistent symptoms, development of deformity, and the risks of anesthesia. The patient understood these risks and agreed to proceed.  Operative Note:   1) Left L4/5 microdiscectomy (redo) 2) L3/4 decompression  The patient was then brought from the preoperative center with intravenous access established.  The patient underwent general anesthesia and endotracheal tube intubation, and was then rotated on the [place] rail top where all pressure points were appropriately padded.  The skin was then thoroughly cleansed.  Perioperative antibiotic prophylaxis was administered.  Sterile prep and drapes were then applied and a timeout was then observed.  C-arm was brought into the field under sterile conditions, and the L4-5 disc space identified and marked with an incision on the left 1cm lateral to midline.  Once this was complete a 2 cm incision was opened with the use of a #10 blade knife.  The Metrx tubes were sequentially advanced under lateral fluoroscopy until a 18 x 60 mm Metrx tube was placed over  the facet and lamina and secured to the bed.    The microscope was then sterilely brought into the field and muscle creep was hemostased with a bipolar and resected with a pituitary rongeur.  A Bovie extender was then used to expose the spinous process and lamina.  Careful attention was placed to not violate the facet capsule.    The prior laminoforaminotomy defect was identified.  The high-speed drill was used to extend that defect superiorly and medially.  The right sided ligamentum flavum was identified.  Using the upgoing curette, the epidural plane on the right side of the spinal canal was developed.  The ligamentum flavum was then removed and the right side was decompressed.  The ball-tipped dissector was used to bring the epidural plane towards the left side.  There was extensive scarring.  Has much of the scar as possible was removed.  During dissection, a densely adherent portion of the scar caused a small durotomy.  This was carefully protected the remainder of the decompression.  We continue to try to develop additional epidural plane on the left side of the spinal canal, but the scar tissue would not allow mobilization of the dura.  A small portion of the recurrent disc was removed.  After extensive investigation and manipulation, it was felt that it would be more dangerous and put her at risk of a nerve root injury if we continue to dissected around the nerve root.  As such, additional bony and soft tissue superficial to the thecal sac was removed until it was felt to be decompressed.  We then turned to hemostasis.  A piece of DuraGen and seal were used to augment closure of the durotomy.  The area was irrigated. The tube system was then removed under microscopic visualization  and hemostasis was obtained with a bipolar.    After performing the decompression at L4-5, the metrx tubes were sequentially advanced and confirmed in position at L3-4. An 18mm by 60mm tube was locked in place to the  bed side attachment.  Fluoroscopy was then removed from the field.  The microscope was then sterilely brought into the field and muscle creep was hemostased with a bipolar and resected with a pituitary rongeur.  A Bovie extender was then used to expose the spinous process and lamina.  Careful attention was placed to not violate the facet capsule. A 3 mm matchstick drill bit was then used to make a hemi-laminotomy trough until the ligamentum flavum was exposed.  This was extended to the base of the spinous process and to the contralateral side to remove all the central bone from each side.  Once this was complete and the underlying ligamentum flavum was visualized, it was dissected with a curette and resected with Kerrison rongeurs.  Extensive ligamentum hypertrophy was noted, requiring a substantial amount of time and care for removal.  The dura was identified and palpated. The kerrison rongeur was then used to remove the medial facet bilaterally until no compression was noted.  A balltip probe was used to confirm decompression of the ipsilateral L4 nerve root.  Additional attention was paid to completion of the contralateral foraminotomy until the contralateral L4 nerve root was completely free.  Once this was complete, L3-4 central decompression including medial facetectomy and foraminotomy was confirmed and decompression on both sides was confirmed. No CSF leak was noted.  The wound was copiously irrigated. The tube system was then removed under microscopic visualization and hemostasis was obtained with a bipolar.     The fascial layer was reapproximated with the use of a 0- Vicryl suture.  Subcutaneous tissue layer was reapproximated using 2-0 Vicryl suture. 3-0 nylon was used on the skin.  Patient was then rotated back to the preoperative bed awakened from anesthesia and taken to recovery all counts are correct in this case.   I performed the entire procedure with the assistance of Edsel Goods PA as  an Designer, television/film set. An assistant was required for this procedure due to the complexity.  The assistant provided assistance in tissue manipulation and suction, and was required for the successful and safe performance of the procedure. I performed the critical portions of the procedure.   Reeves Daisy MD

## 2024-03-23 NOTE — Anesthesia Preprocedure Evaluation (Signed)
 Anesthesia Evaluation  Patient identified by MRN, date of birth, ID band Patient awake    Reviewed: Allergy & Precautions, H&P , NPO status , Patient's Chart, lab work & pertinent test results  History of Anesthesia Complications (+) history of anesthetic complications  Airway Mallampati: II  TM Distance: >3 FB Neck ROM: Full    Dental no notable dental hx. (+) Edentulous Upper, Edentulous Lower   Pulmonary neg pulmonary ROS, former smoker   Pulmonary exam normal breath sounds clear to auscultation       Cardiovascular Exercise Tolerance: Good hypertension, Pt. on medications negative cardio ROS Normal cardiovascular exam Rhythm:Regular Rate:Normal  EKG: 04/26/2021 Rate 87 bpm  NSR Minimal voltage criteria for LVH, may be normal variant Possible anterior infarct, age undetermined   Stress Test 06/15/2020 IMPRESSIONS:  Low risk probably normal myocardial perfusion study  There is a small in size, moderate fixed perfusion abnormality involving the apical and apical anterior segments. This is consistent with probable artifact given normal wall motion in this region.  Left ventricular systolic function is normal. Post stress the ejection fraction is > 65%. The left ventricle is normal in size.  No significant coronary calcifications were noted on the attenuation CT  Incidental CT findings: Incidental note is made of a patulous esophagus.   Neuro/Psych  Headaches negative neurological ROS  negative psych ROS   GI/Hepatic negative GI ROS, Neg liver ROS,,,  Endo/Other  negative endocrine ROSdiabetes    Renal/GU      Musculoskeletal   Abdominal   Peds  Hematology negative hematology ROS (+)   Anesthesia Other Findings PROCEDURE:     DXR - DXR LUMBAR SPINE WITH OBLIQUES  - Nov 13 2004  2:43PM   RESULT:     Multiple views reveal normal alignment and curvature.  The  visualized bony skeleton and intervertebral disk  spaces are intact.  No  spondylolysis and/or spondylolisthesis is noted.  Again seen is the thecal  sac catheter that appears to include the spinal canal at approximately  T12-L1 level posteriorly.  The catheter appears intact.    Reproductive/Obstetrics negative OB ROS                              Anesthesia Physical Anesthesia Plan  ASA: 3  Anesthesia Plan: General ETT   Post-op Pain Management:    Induction: Intravenous  PONV Risk Score and Plan: Ondansetron , Dexamethasone , Midazolam , Propofol  infusion and TIVA  Airway Management Planned: Oral ETT  Additional Equipment:   Intra-op Plan:   Post-operative Plan: Extubation in OR  Informed Consent: I have reviewed the patients History and Physical, chart, labs and discussed the procedure including the risks, benefits and alternatives for the proposed anesthesia with the patient or authorized representative who has indicated his/her understanding and acceptance.     Dental Advisory Given  Plan Discussed with: Anesthesiologist, CRNA and Surgeon  Anesthesia Plan Comments: (Patient consented for risks of anesthesia including but not limited to:  - adverse reactions to medications - damage to eyes, teeth, lips or other oral mucosa - nerve damage due to positioning  - sore throat or hoarseness - Damage to heart, brain, nerves, lungs, other parts of body or loss of life  Patient voiced understanding and assent.)        Anesthesia Quick Evaluation
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
## 2024-03-23 NOTE — Transfer of Care (Signed)
 Immediate Anesthesia Transfer of Care Note  Patient: Brittany Morris  Procedure(s) Performed: LUMBAR LAMINECTOMY/DECOMPRESSION MICRODISCECTOMY 2 LEVELS REPAIR OF CEREBROSPINAL FLUID LEAK (Spine Lumbar)  Patient Location: PACU  Anesthesia Type:General  Level of Consciousness: drowsy  Airway & Oxygen Therapy: Patient Spontanous Breathing and Patient connected to face mask oxygen  Post-op Assessment: Report given to RN and Post -op Vital signs reviewed and stable  Post vital signs: Reviewed and stable  Last Vitals:  Vitals Value Taken Time  BP 115/67 03/23/24 10:06  Temp 36   Pulse 81 03/23/24 10:10  Resp 10 03/23/24 10:10  SpO2 100 % 03/23/24 10:10  Vitals shown include unfiled device data.  Last Pain:  Vitals:   03/23/24 0615  TempSrc: Temporal  PainSc: 7          Complications: There were no known notable events for this encounter.

## 2024-03-23 NOTE — Anesthesia Procedure Notes (Signed)
 Procedure Name: Intubation Date/Time: 03/23/2024 7:25 AM  Performed by: Jackye Spanner, CRNAPre-anesthesia Checklist: Patient identified, Emergency Drugs available, Suction available and Patient being monitored Patient Re-evaluated:Patient Re-evaluated prior to induction Oxygen Delivery Method: Circle system utilized Preoxygenation: Pre-oxygenation with 100% oxygen Induction Type: IV induction Ventilation: Mask ventilation without difficulty Laryngoscope Size: McGrath and 3 Grade View: Grade I Tube type: Oral Tube size: 7.0 mm Number of attempts: 1 Airway Equipment and Method: Stylet and Oral airway Placement Confirmation: ETT inserted through vocal cords under direct vision, positive ETCO2 and breath sounds checked- equal and bilateral Secured at: 21 cm Tube secured with: Tape Dental Injury: Teeth and Oropharynx as per pre-operative assessment

## 2024-03-23 NOTE — Plan of Care (Signed)
  Problem: Pain Managment: Goal: General experience of comfort will improve and/or be controlled Outcome: Progressing   Problem: Safety: Goal: Ability to remain free from injury will improve Outcome: Progressing   Problem: Skin Integrity: Goal: Risk for impaired skin integrity will decrease Outcome: Progressing   Problem: Pain Management: Goal: Pain level will decrease Outcome: Progressing

## 2024-03-23 NOTE — Discharge Instructions (Signed)

## 2024-03-24 ENCOUNTER — Encounter: Payer: Self-pay | Admitting: Neurosurgery

## 2024-03-24 ENCOUNTER — Other Ambulatory Visit: Payer: Self-pay

## 2024-03-24 DIAGNOSIS — M48061 Spinal stenosis, lumbar region without neurogenic claudication: Secondary | ICD-10-CM | POA: Diagnosis not present

## 2024-03-24 LAB — GLUCOSE, CAPILLARY: Glucose-Capillary: 158 mg/dL — ABNORMAL HIGH (ref 70–99)

## 2024-03-24 MED ORDER — SENNA 8.6 MG PO TABS
1.0000 | ORAL_TABLET | Freq: Two times a day (BID) | ORAL | 0 refills | Status: DC | PRN
  Filled 2024-03-24: qty 30, 15d supply, fill #0

## 2024-03-24 MED ORDER — OXYCODONE HCL 10 MG PO TABS
10.0000 mg | ORAL_TABLET | ORAL | 0 refills | Status: DC | PRN
  Filled 2024-03-24: qty 60, 8d supply, fill #0

## 2024-03-24 MED ORDER — MUPIROCIN 2 % EX OINT
1.0000 | TOPICAL_OINTMENT | Freq: Two times a day (BID) | CUTANEOUS | 0 refills | Status: DC
  Filled 2024-03-24: qty 22, 11d supply, fill #0

## 2024-03-24 MED ORDER — PREDNISONE 5 MG PO TABS
5.0000 mg | ORAL_TABLET | Freq: Every day | ORAL | 0 refills | Status: DC
  Filled 2024-03-24: qty 3, 3d supply, fill #0

## 2024-03-24 MED ORDER — CHLORHEXIDINE GLUCONATE 4 % EX SOLN
1.0000 | CUTANEOUS | 1 refills | Status: AC
  Filled 2024-03-24: qty 472, 30d supply, fill #0

## 2024-03-24 MED ORDER — METHOCARBAMOL 500 MG PO TABS
500.0000 mg | ORAL_TABLET | Freq: Four times a day (QID) | ORAL | 0 refills | Status: AC | PRN
  Filled 2024-03-24: qty 120, 30d supply, fill #0

## 2024-03-24 NOTE — Plan of Care (Signed)
 Progressing towards discharge

## 2024-03-24 NOTE — Progress Notes (Signed)
 DISCHARGE NOTE  Pt given discharge instructions, and verbalized understanding. TED hose on both legs. Medication delivered to bedside and sent with pt. BSC also sent. Pt wheeled to car by staff, Daughter providing transportation home.

## 2024-03-24 NOTE — Plan of Care (Signed)
  Problem: Activity: Goal: Risk for activity intolerance will decrease Outcome: Progressing   Problem: Nutrition: Goal: Adequate nutrition will be maintained Outcome: Progressing   Problem: Elimination: Goal: Will not experience complications related to urinary retention Outcome: Progressing   

## 2024-03-24 NOTE — Evaluation (Signed)
 Occupational Therapy Evaluation Patient Details Name: Brittany Morris MRN: [id number] DOB: July 12, 1966 Today's Date: 03/24/2024   History of Present Illness   Pt is a 58 y.o. female with lumbar radiculopathy and chronic pain syndrome. She is POD 1 L3-4 decompression and left L4-5 microdiscectomy. PMH: OA, DM, HTN, previous B TKAs     Clinical Impressions Pt was seen for OT evaluation this date. PTA, pt lives alone in a one level home with 5 STE. She was IND with all tasks prior to admission, driving and active.  Pt presents to acute OT demonstrating impaired ADL performance and functional mobility 2/2 pain and mild weakness. Pt currently requires supervision and increased time with bed mobility via log roll technique, but no physical assist provided. Performed slow with initial dizziness that resolved quickly. Supervision for STS to RW and ambulation to the bathroom. Toilet transfer with SUP onto [facility] over toilet. UB and LB dressing performed with setup assist. Ambulated back to recliner without AD with SBA. Recommend BSC for safe DC. Will follow acutely to prevent further decline or weakness. Do not anticipate the need for follow up OT services upon acute hospital DC.      If plan is discharge home, recommend the following:   A little help with walking and/or transfers;Assistance with cooking/housework;Assist for transportation;Help with stairs or ramp for entrance     Functional Status Assessment   Patient has had a recent decline in their functional status and demonstrates the ability to make significant improvements in function in a reasonable and predictable amount of time.     Equipment Recommendations   BSC/3in1     Recommendations for Other Services         Precautions/Restrictions   Precautions Precautions: Back Precaution Booklet Issued: Yes (comment) Recall of Precautions/Restrictions: Intact Restrictions Weight Bearing Restrictions Per Provider Order: No      Mobility Bed Mobility Overal bed mobility: Needs Assistance Bed Mobility: Rolling, Sidelying to Sit Rolling: Supervision Sidelying to sit: Contact guard assist       General bed mobility comments: edu on log roll technique, performed with good carryover and mild effort    Transfers Overall transfer level: Needs assistance Equipment used: Rolling walker (2 wheels) Transfers: Sit to/from Stand Sit to Stand: Supervision           General transfer comment: ambulated to the bathroom using RW with supervision, preferred to not use it coming back to recliner and ambulated with mild imbalance, no overt LOB without for short distance      Balance Overall balance assessment: Needs assistance Sitting-balance support: Feet supported Sitting balance-Leahy Scale: Good       Standing balance-Leahy Scale: Good Standing balance comment: with RW supervision, without SBA/CGA although it was her first time up                           ADL either performed or assessed with clinical judgement   ADL                                               Vision         Perception         Praxis         Pertinent Vitals/Pain Pain Assessment Pain Assessment: 0-10 Pain Score: 6  Pain Location: lower back Pain  Descriptors / Indicators: Aching, Throbbing, Sore Pain Intervention(s): Monitored during session, Repositioned, Limited activity within patient's tolerance     Extremity/Trunk Assessment Upper Extremity Assessment Upper Extremity Assessment: Overall WFL for tasks assessed   Lower Extremity Assessment Lower Extremity Assessment: Overall WFL for tasks assessed       Communication Communication Communication: No apparent difficulties   Cognition Arousal: Alert Behavior During Therapy: WFL for tasks assessed/performed Cognition: No apparent impairments                               Following commands: Intact        Cueing  General Comments      dressing intact pre/post session   Exercises Other Exercises Other Exercises: Edu on role of OT in acute setting and back precautions as well as AE/AD/DME use and recommendations for safe DC home including task modification and family managing higher level household chores. Pt verbalized understanding.   Shoulder Instructions      Home Living Family/patient expects to be discharged to:: Private residence Living Arrangements: Alone Available Help at Discharge: Family;Friend(s);Neighbor;Available PRN/intermittently Type of Home: Mobile home Home Access: Stairs to enter Entrance Stairs-Number of Steps: 5 Entrance Stairs-Rails: Left;Right Home Layout: One level     Bathroom Shower/Tub: Chief Strategy Officer: Standard Bathroom Accessibility: Yes   Home Equipment: Shower seat;Grab bars - toilet          Prior Functioning/Environment Prior Level of Function : Independent/Modified Independent;Driving             Mobility Comments: IND; denies falls ADLs Comments: IND with ADLs, IADLs, driving, grocery shopping; pt is on disability    OT Problem List: Impaired balance (sitting and/or standing);Pain   OT Treatment/Interventions: Self-care/ADL training;Therapeutic exercise;Patient/family education;Balance training;Therapeutic activities;DME and/or AE instruction      OT Goals(Current goals can be found in the care plan section)   Acute Rehab OT Goals Patient Stated Goal: go home OT Goal Formulation: With patient Time For Goal Achievement: 04/07/24 Potential to Achieve Goals: Good ADL Goals Pt Will Perform Lower Body Bathing: with modified independence;sit to/from stand;sitting/lateral leans Pt Will Perform Lower Body Dressing: with modified independence;sit to/from stand;sitting/lateral leans Pt Will Transfer to Toilet: with modified independence;Independently;ambulating;regular height toilet   OT Frequency:  Min  3X/week    Co-evaluation              AM-PAC OT 6 Clicks Daily Activity     Outcome Measure Help from another person eating meals?: None Help from another person taking care of personal grooming?: None Help from another person toileting, which includes using toliet, bedpan, or urinal?: None Help from another person bathing (including washing, rinsing, drying)?: A Little Help from another person to put on and taking off regular upper body clothing?: None Help from another person to put on and taking off regular lower body clothing?: None 6 Click Score: 23   End of Session Equipment Utilized During Treatment: Rolling walker (2 wheels) Nurse Communication: Mobility status  Activity Tolerance: Patient tolerated treatment well Patient left: in chair;with call bell/phone within reach  OT Visit Diagnosis: Other abnormalities of gait and mobility (R26.89)                Time: 9247-9164 OT Time Calculation (min): 43 min Charges:  OT General Charges $OT Visit: 1 Visit OT Evaluation $OT Eval Low Complexity: 1 Low OT Treatments $Self Care/Home Management : 23-37 mins  [address]  Brittany Morris, OTR/L  03/24/24, 12:53 PM   Brittany Morris 03/24/2024, 12:50 PM

## 2024-03-24 NOTE — Discharge Summary (Signed)
 Discharge Summary  Patient ID: Brittany Morris MRN: [id number] DOB/AGE: Sep 14, 1965 58 y.o.  Admit date: 03/23/2024 Discharge date: 03/24/2024  Admission Diagnoses: Lumbar radiculopathy (ICD-10 M54.16)   Discharge Diagnoses:  Principal Problem:   Lumbar stenosis Active Problems:   Lumbar radiculopathy   Left leg weakness   Recurrent herniation of lumbar disc   Discharged Condition: good  Hospital Course:  Brittany Morris is a 58 y.o presenting with lumbar stenosis and radiculopathy s/p left L4-5 redo microdiscectomy and L3-4 decompression. Her intraoperative course was complicated by a CSF leak which was repaired intraoperatively. She was kept flat overnight and did not experience incisional drainage or positional headaches post-op. She was seen by therapy and deemed appropriate for discharge home with DME. She was given prescriptions for Oxycodone , Robaxin , and Senna to take as needed as well as an escalated dose of Prednisone  as recommended by her pre-op clearance.   Consults: None  Significant Diagnostic Studies: NA  Treatments: surgery: as above. Please see separately dictated operative report for further details.   Discharge Exam: Blood pressure 114/68, pulse 75, temperature 98 F (36.7 C), temperature source Oral, resp. rate 15, height 5' 6 (1.676 m), weight 96.6 kg, SpO2 97%. AA Ox3 CNI   Strength:   Side Iliopsoas Quads Hamstring PF DF EHL  R 5 5 5 5 5 5   L 5 4- 5 5 4 4    Incision: covered with clean, dry post-op dressing.     Disposition: Discharge disposition: 01-Home or Self Care       Discharge Instructions     Incentive spirometry RT   Complete by: As directed    Remove dressing in 48 hours   Complete by: As directed       Allergies as of 03/24/2024       Reactions   Baclofen    Dizziness   Hydromorphone  Other (See Comments)   hallucinations   Metformin And Related Diarrhea, Nausea And Vomiting   Ciprofloxacin Rash        Medication List      PAUSE taking these medications    predniSONE  2.5 MG tablet Wait to take this until your doctor or other care provider tells you to start again. Commonly known as: DELTASONE  Take 2.5 mg by mouth daily. You also have another medication with the same name that you may need to continue taking.       STOP taking these medications    insulin  lispro 100 UNIT/ML injection Commonly known as: HUMALOG    tiZANidine  4 MG tablet Commonly known as: ZANAFLEX        TAKE these medications    CALCIUM 500 PO Take 500 mg by mouth daily.   chlorhexidine  4 % external liquid Commonly known as: HIBICLENS  Apply 15 mLs (1 Application total) topically as directed for 30 doses. Use as directed daily for 5 days every other week for 6 weeks.   divalproex  500 MG DR tablet Commonly known as: DEPAKOTE  Take 500 mg by mouth 2 (two) times daily.   DULoxetine  60 MG capsule Commonly known as: CYMBALTA  Take 60 mg by mouth 2 (two) times daily.   empagliflozin  10 MG Tabs tablet Commonly known as: JARDIANCE  Take 10 mg by mouth daily.   ferrous sulfate  325 (65 FE) MG tablet Take 325 mg by mouth daily.   furosemide  20 MG tablet Commonly known as: LASIX  Take 20 mg by mouth daily.   gabapentin  300 MG capsule Commonly known as: NEURONTIN  Take 600 mg by mouth 2 (two) times  daily.   insulin  degludec 100 UNIT/ML FlexTouch Pen Commonly known as: TRESIBA Inject 17 Units into the skin daily. Increase 2 units every 3 days   lisinopril  40 MG tablet Commonly known as: ZESTRIL  Take 40 mg by mouth daily.   Magnesium  400 MG Tabs Take 400-800 mg by mouth See admin instructions. Take 800 mg in the morning and 400 mg at night   methocarbamol  500 MG tablet Commonly known as: ROBAXIN  Take 1 tablet (500 mg total) by mouth every 6 (six) hours as needed for muscle spasms. What changed:  when to take this reasons to take this   MORPHINE  SULFATE IV 20 mg/mL by Intrathecal Infusion route continuous. 10.503  mg/day   mupirocin  ointment 2 % Commonly known as: BACTROBAN  Place 1 Application into the nose 2 (two) times daily for 60 doses. Use as directed 2 times daily for 5 days every other week for 6 weeks. What changed:  how much to take how to take this when to take this additional instructions   Nurtec 75 MG Tbdp Generic drug: Rimegepant Sulfate Take 75 mg by mouth daily as needed.   nystatin cream Commonly known as: MYCOSTATIN Apply 1 application topically 2 (two) times daily as needed for rash.   Omega-3 1000 MG Caps Take 1,000 mg by mouth at bedtime.   Oxycodone  HCl 10 MG Tabs Take 1 tablet (10 mg total) by mouth every 3 (three) hours as needed for severe pain (pain score 7-10). What changed:  medication strength when to take this   OZEMPIC (1 MG/DOSE) Silex Inject 1 mg into the skin once a week. sunday   pantoprazole  40 MG tablet Commonly known as: PROTONIX  Take 40 mg by mouth 2 (two) times daily.   predniSONE  5 MG tablet Commonly known as: DELTASONE  Take 1 tablet (5 mg total) by mouth daily with breakfast for 3 days. Start taking on: March 25, 2024 What changed: Another medication with the same name was paused. Ask your nurse or doctor if you should take this medication.   primidone  50 MG tablet Commonly known as: MYSOLINE  Take 50 mg by mouth 2 (two) times daily. 50 mg in the am and 100 mg at bedtime   promethazine  12.5 MG tablet Commonly known as: PHENERGAN  Take 12.5 mg by mouth every 6 (six) hours as needed for nausea or vomiting.   senna 8.6 MG Tabs tablet Commonly known as: SENOKOT Take 1 tablet (8.6 mg total) by mouth 2 (two) times daily as needed for mild constipation.   simvastatin  40 MG tablet Commonly known as: ZOCOR  Take 40 mg by mouth at bedtime.   vitamin C 1000 MG tablet Take 1,000 mg by mouth daily.   Vitamin D  50 MCG (2000 UT) tablet Take 2,000 Units by mouth. 3 times a week               Durable Medical Equipment  (From admission,  onward)           Start     Ordered   03/24/24 0939  For home use only DME Bedside commode  Once       Question:  Patient needs a bedside commode to treat with the following condition  Answer:  Lumbar stenosis   03/24/24 9061             Signed: Edsel Jama Goods 03/24/2024, 9:55 AM

## 2024-03-24 NOTE — Progress Notes (Signed)
   REFERRING PHYSICIAN:  [facility], [facility] [address] [place],  [place] [address]  DOS: 03/23/24  Left L4-L5 redo microdiscectomy and L3-L4 decompression  DOS: 10/07/19 L4-L5 hemilaminectomy and microdiscectomy  HISTORY OF PRESENT ILLNESS: Brittany Morris is approximately 2 weeks status post above surgery. She had CSF leak that was repaired intraoperatively. Was given oxycodone  and robaxin  on discharge from the hospital.   She has a pain pump that she is still using. She is back on her chronic dose of prednisone .  She called last week and her neurontin  was increased to 600mg  tid. She was to take 1000mg  of robaxin  at breakfast and q hs with 500mg  at lunch. She continues on oxycodone  10mg  q 3 hours prn and needs a refill. She was on oxycodone  5mg  q 6 hours prior to surgery (from pain management).   She is doing better since changing her medications around as above- she was having bilateral leg pain. Still having some back and hip pain along with intermittent left lateral/posterior leg pain to her foot. Thinks preop left leg pain may be some better. No numbness.    PHYSICAL EXAMINATION:  General: Patient is well developed, well nourished, calm, collected, and in no apparent distress.   NEUROLOGICAL:  General: In no acute distress.   Awake, alert, oriented to person, place, and time.  Pupils equal round and reactive to light.  Facial tone is symmetric.     Strength:            Side Iliopsoas Quads Hamstring PF DF EHL  R 5 5 5 5 5 5   L 5 5 5 5 5 5    Incision c/d/I, sutures removed without complication. Some irritation noted from sutures, no surround erythema of signs of infection.    ROS (Neurologic):  Negative except as noted above  IMAGING: Nothing new to review.   ASSESSMENT/PLAN:  Brittany Morris is doing well s/p above surgery. Treatment options reviewed with patient and following plan made:   - I have advised the patient to lift up to 10 pounds until 6 weeks  after surgery (follow up with Dr. Clois).  - Reviewed wound care.  - No bending, twisting, or lifting.  - Continue on current medications including neurontin , robaxin , and prn oxycodone .  - Refills sent of phenergan  and senna.  - Refill of oxycodone  10mg  to continue q 3 hours prn severe pain. Will try to go to q 4 hours at her next refill.  - We can fill oxycodone  for next few prescriptions and then she can get back with pain management. She will call and schedule a follow up.  - Preop nasal swab was positive for staph and MRSA- patient is to continue mupirocin  and chloraprep x 6 weeks.  - Follow up as scheduled in 4 weeks and prn.   Advised to contact the office if any questions or concerns arise.  Glade Boys PA-C Department of neurosurgery

## 2024-03-24 NOTE — Care Management Obs Status (Signed)
 MEDICARE OBSERVATION STATUS NOTIFICATION   Patient Details  Name: Brittany Morris MRN: [id number] Date of Birth: 06-Jan-1966   Medicare Observation Status Notification Given:  No (patient did not want a copy)    Rojelio SHAUNNA Rattler 03/24/2024, 11:04 AM

## 2024-03-24 NOTE — Evaluation (Signed)
 Physical Therapy Evaluation Patient Details Name: Brittany Morris MRN: [id number] DOB: 06/19/1966 Today's Date: 03/24/2024  History of Present Illness  Pt is a 58 y.o. female with lumbar radiculopathy and chronic pain syndrome. She is POD 1 L3-4 decompression and left L4-5 microdiscectomy. PMH: OA, DM, HTN, previous B TKAs  Clinical Impression  Pt admitted with above diagnosis. Pt currently with functional limitations due to the deficits listed below (see PT Problem List). Pt received upright in recliner agreeable to PT. Reports PTA being indep with gait and ADL's.   To date, pt is supervision for STS and gait ~200'. Pt completes step through gait with no overt LOB. Completes stair training safely. Reviewed BLT and log roll technique throughout session. Also discussed energy conservation. Pt and author in agreement with BSC but not necessary for RW needs at this point. Also discussed benefits of HHPT services. Pt in agreement in plan and understanding of education. Pt left in recliner with all needs in reach. Will benefit from d/c recs to address mild gait deficits and pain.       If plan is discharge home, recommend the following: Assist for transportation;Help with stairs or ramp for entrance   Can travel by private vehicle        Equipment Recommendations BSC/3in1  Recommendations for Other Services       Functional Status Assessment Patient has had a recent decline in their functional status and demonstrates the ability to make significant improvements in function in a reasonable and predictable amount of time.     Precautions / Restrictions Precautions Precautions: Back Precaution Booklet Issued: Yes (comment) Recall of Precautions/Restrictions: Intact Restrictions Weight Bearing Restrictions Per Provider Order: No      Mobility  Bed Mobility               General bed mobility comments: NT. In recliner pre and post session Patient Response:  Cooperative  Transfers Overall transfer level: Needs assistance   Transfers: Sit to/from Stand Sit to Stand: Supervision                Ambulation/Gait Ambulation/Gait assistance: Supervision Gait Distance (Feet): 200 Feet Assistive device: None Gait Pattern/deviations: Step-through pattern, Decreased step length - right, Decreased step length - left       General Gait Details: decreased step lengths but completes step through gait. Reports slowed gait speed but is steady overall without LOB.  Stairs Stairs: Yes Stairs assistance: Modified independent (Device/Increase time) Stair Management: One rail Right, Forwards, Step to pattern Number of Stairs: 4 General stair comments: completes safely with single rail use  Wheelchair Mobility     Tilt Bed Tilt Bed Patient Response: Cooperative  Modified Rankin (Stroke Patients Only)       Balance Overall balance assessment: Needs assistance Sitting-balance support: Feet supported Sitting balance-Leahy Scale: Good       Standing balance-Leahy Scale: Good                               Pertinent Vitals/Pain Pain Assessment Pain Assessment: Faces Faces Pain Scale: Hurts little more Pain Location: lower back Pain Descriptors / Indicators: Aching, Throbbing, Sore Pain Intervention(s): Limited activity within patient's tolerance, Monitored during session, Repositioned, Premedicated before session    Home Living Family/patient expects to be discharged to:: Private residence Living Arrangements: Alone Available Help at Discharge: Family;Friend(s);Neighbor;Available PRN/intermittently Type of Home: Mobile home Home Access: Stairs to enter Entrance Stairs-Rails: Lawyer  of Steps: 5   Home Layout: One level Home Equipment: Shower seat;Grab bars - toilet      Prior Function Prior Level of Function : Independent/Modified Independent;Driving             Mobility Comments:  IND; denies falls ADLs Comments: IND with ADLs, IADLs, driving, grocery shopping; pt is on disability     Extremity/Trunk Assessment   Upper Extremity Assessment Upper Extremity Assessment: Overall WFL for tasks assessed    Lower Extremity Assessment Lower Extremity Assessment: Overall WFL for tasks assessed       Communication   Communication Communication: No apparent difficulties    Cognition Arousal: Alert Behavior During Therapy: WFL for tasks assessed/performed   PT - Cognitive impairments: No apparent impairments                         Following commands: Intact       Cueing Cueing Techniques: Verbal cues     General Comments General comments (skin integrity, edema, etc.): dressing intact pre/post session    Exercises Other Exercises Other Exercises: reviewed BLT, log roll technique. Benefits of HHPT services.   Assessment/Plan    PT Assessment Patient needs continued PT services  PT Problem List Decreased strength;Decreased balance;Pain;Decreased activity tolerance       PT Treatment Interventions Therapeutic activities;Gait training;Therapeutic exercise;Patient/family education;Stair training;Balance training;Functional mobility training;Neuromuscular re-education    PT Goals (Current goals can be found in the Care Plan section)  Acute Rehab PT Goals Patient Stated Goal: to go home PT Goal Formulation: With patient Time For Goal Achievement: 04/07/24 Potential to Achieve Goals: Good    Frequency Min 1X/week     Co-evaluation               AM-PAC PT 6 Clicks Mobility  Outcome Measure Help needed turning from your back to your side while in a flat bed without using bedrails?: A Little Help needed moving from lying on your back to sitting on the side of a flat bed without using bedrails?: A Little Help needed moving to and from a bed to a chair (including a wheelchair)?: A Little Help needed standing up from a chair using your  arms (e.g., wheelchair or bedside chair)?: A Little Help needed to walk in hospital room?: A Little Help needed climbing 3-5 steps with a railing? : A Little 6 Click Score: 18    End of Session   Activity Tolerance: Patient tolerated treatment well Patient left: in chair;with call bell/phone within reach Nurse Communication: Mobility status PT Visit Diagnosis: Muscle weakness (generalized) (M62.81);Difficulty in walking, not elsewhere classified (R26.2)    Time: 9062-9053 PT Time Calculation (min) (ACUTE ONLY): 9 min   Charges:   PT Evaluation $PT Eval Low Complexity: 1 Low   PT General Charges $$ ACUTE PT VISIT: 1 Visit         Dorina HERO. Fairly IV, PT, DPT Physical Therapist- [employer]  [facility] 03/24/2024, 10:12 AM

## 2024-03-24 NOTE — Progress Notes (Signed)
   Neurosurgery Progress Note  History: Brittany Morris is s/p left L4-5 redo microdiscectomy and L3-4 decompression   POD1: Doing well this morning. Pt has been flat overnight due to intraoperative CSF leak. Denies any positional headache and feels her pain is well controlled on current regimen  Physical Exam: Vitals:   03/24/24 0413 03/24/24 0806  BP: 114/67 94/60  Pulse: (!) 58 69  Resp: 18 17  Temp: (!) 97.3 F (36.3 C) 98 F (36.7 C)  SpO2: 95% 98%    AA Ox3 CNI  Strength:   Side Iliopsoas Quads Hamstring PF DF EHL  R 5 5 5 5 5 5   L 5 4- 5 5 4 4    Incision: covered with clean, dry post-op dressing.   Data:  Other tests/results: see results review   Assessment/Plan:  Brittany Morris is a 58 y.o presenting with lumbar radiculopathy and left leg weakness s/p left L4-5 redo microdiscectomy and L3-4 decompression   - mobilize - pain control - DVT prophylaxis - PTOT; dispo pending recommendations   Edsel Goods PA-C Department of Neurosurgery

## 2024-03-29 ENCOUNTER — Encounter: Payer: Self-pay | Admitting: Neurosurgery

## 2024-03-30 ENCOUNTER — Other Ambulatory Visit: Payer: Self-pay | Admitting: Neurosurgery

## 2024-03-30 MED ORDER — ACETAMINOPHEN 500 MG PO TABS: 1000.0000 mg | ORAL_TABLET | Freq: Four times a day (QID) | ORAL | 0 refills | Status: AC | PRN

## 2024-03-30 MED ORDER — OXYCODONE HCL 10 MG PO TABS: 10.0000 mg | ORAL_TABLET | ORAL | 0 refills | Status: DC | PRN

## 2024-03-30 MED ORDER — GABAPENTIN 300 MG PO CAPS: 600.0000 mg | ORAL_CAPSULE | Freq: Three times a day (TID) | ORAL | 0 refills | Status: AC

## 2024-03-30 NOTE — Telephone Encounter (Signed)
 Spoke with patient. She had her surgery on 03/23/2024. First day at home her pain was manageable and not as intense as it is now. She is having to still take Oxycodone  1 tablet every 3 hours, if she does not her pain is intense and it does wakes her up at night from her sleep at that 3 hour mark. Pain is located in the middle of both buttocks and radiates down the back of both of her legs down to the knees and sometimes all the way to both feet area. Muscle spasms have been present since she has been home and present in both legs also, sometimes she is not able to tell if its a pain or spasm issue. She feels spasms with any movement per patient. She is not sure if muscle relaxer is helping or not. She states she feels like she is not telly a clear story of her symptoms but this is how she can explain at this time what is is experiencing.  She feels that overall her pain and spasms are very slowly improving but are still very intense and making her almost cry and she is concerned with having to take her pain medication so often still but if she does not she can not take the pain.  She is taking- Cymbalta  60 mg 1 tablet BID. Gabapentin  300 mg 2 tablets BID, Robaxin  500 mg 1 every 6 hours, Oxycodone  10 mg 1 every 3 hours, and she is back to taking her regular Prednisone  2.5 1 tablet daily She is not taking any Tylenol  or Ibuprofen.  No new symptoms aside from spasms. No leg weakness. NO falls.

## 2024-03-30 NOTE — Telephone Encounter (Signed)
 Spoke with patient and advised to take a look at the mychart message and start on the adjustments of her medications and update us  if she has any questions or needs refills on the medications in order to make adjustments.

## 2024-04-06 ENCOUNTER — Ambulatory Visit (INDEPENDENT_AMBULATORY_CARE_PROVIDER_SITE_OTHER): Admitting: Orthopedic Surgery

## 2024-04-06 ENCOUNTER — Encounter: Payer: Self-pay | Admitting: Orthopedic Surgery

## 2024-04-06 VITALS — BP 118/84 | Temp 98.5°F | Ht 66.0 in | Wt 213.0 lb

## 2024-04-06 DIAGNOSIS — Z9889 Other specified postprocedural states: Secondary | ICD-10-CM

## 2024-04-06 DIAGNOSIS — Z09 Encounter for follow-up examination after completed treatment for conditions other than malignant neoplasm: Secondary | ICD-10-CM

## 2024-04-06 DIAGNOSIS — M5126 Other intervertebral disc displacement, lumbar region: Secondary | ICD-10-CM

## 2024-04-06 DIAGNOSIS — M5416 Radiculopathy, lumbar region: Secondary | ICD-10-CM

## 2024-04-06 MED ORDER — SENNA 8.6 MG PO TABS: 1.0000 | ORAL_TABLET | Freq: Two times a day (BID) | ORAL | 0 refills | Status: AC | PRN

## 2024-04-06 MED ORDER — PROMETHAZINE HCL 12.5 MG PO TABS: 12.5000 mg | ORAL_TABLET | Freq: Three times a day (TID) | ORAL | 0 refills | Status: AC | PRN

## 2024-04-06 MED ORDER — OXYCODONE HCL 10 MG PO TABS: 10.0000 mg | ORAL_TABLET | ORAL | 0 refills | Status: DC | PRN

## 2024-04-12 DIAGNOSIS — M5126 Other intervertebral disc displacement, lumbar region: Secondary | ICD-10-CM

## 2024-04-12 DIAGNOSIS — Z9889 Other specified postprocedural states: Secondary | ICD-10-CM

## 2024-04-13 MED ORDER — OXYCODONE HCL 10 MG PO TABS: 10.0000 mg | ORAL_TABLET | ORAL | 0 refills | Status: DC | PRN

## 2024-04-13 NOTE — Telephone Encounter (Addendum)
 DOS: 03/23/24  Left L4-L5 redo microdiscectomy and L3-L4 decompression   DOS: 10/07/19 L4-L5 hemilaminectomy and microdiscectomy  She was taking oxycodone  5m q 6 hours prior to surgery.   Will change to oxycodone  10 one q 4 hours. PMP reviewed and is appropriate.   This will likely be last refill from us  as she will be getting further refills from pain management.

## 2024-04-13 NOTE — Addendum Note (Signed)
 Addended byBETHA HILMA HASTINGS on: 04/13/2024 07:50 PM   Modules accepted: Orders

## 2024-04-23 ENCOUNTER — Ambulatory Visit (INDEPENDENT_AMBULATORY_CARE_PROVIDER_SITE_OTHER): Admitting: Orthopedic Surgery

## 2024-04-23 ENCOUNTER — Encounter: Payer: Self-pay | Admitting: Orthopedic Surgery

## 2024-04-23 DIAGNOSIS — M5416 Radiculopathy, lumbar region: Secondary | ICD-10-CM

## 2024-04-23 DIAGNOSIS — M5126 Other intervertebral disc displacement, lumbar region: Secondary | ICD-10-CM

## 2024-04-23 DIAGNOSIS — Z9889 Other specified postprocedural states: Secondary | ICD-10-CM

## 2024-04-23 DIAGNOSIS — Z09 Encounter for follow-up examination after completed treatment for conditions other than malignant neoplasm: Secondary | ICD-10-CM

## 2024-04-23 MED ORDER — CEPHALEXIN 500 MG PO CAPS: 500.0000 mg | ORAL_CAPSULE | Freq: Three times a day (TID) | ORAL | 0 refills | Status: AC

## 2024-04-23 NOTE — Telephone Encounter (Signed)
 Please have her see me today at 3:30.   Let me know if she can't make it.

## 2024-04-23 NOTE — Progress Notes (Signed)
   REFERRING PHYSICIAN:  [facility], [facility] [address] [place],  [place] [address]  DOS: 03/23/24  Left L4-L5 redo microdiscectomy and L3-L4 decompression  DOS: 10/07/19 L4-L5 hemilaminectomy and microdiscectomy  HISTORY OF PRESENT ILLNESS:   She's had increased pain at her incision for last 5 days. Cannot let her pants touch it due to pain. No drainage noted. No fevers or chills.   Was doing well up until this point with minimal LBP. Her leg pain has improved.   She has a pain pump that she is still using. She is back on her chronic dose of prednisone . Pain management is prescribing her oxycodone . She is taking robaxin  and neurontin .    PHYSICAL EXAMINATION:  General: Patient is well developed, well nourished, calm, collected, and in no apparent distress.   NEUROLOGICAL:  General: In no acute distress.   Awake, alert, oriented to person, place, and time.  Pupils equal round and reactive to light.  Facial tone is symmetric.     Strength:            Side Iliopsoas Quads Hamstring PF DF EHL  R 5 5 5 5 5 5   L 5 5 5 5 5 5      As above, she has mild erythema around her incision. Minimal swelling. No gross fluctuance. She has significant tenderness over the incision. No open wound noted. No drainage.    ROS (Neurologic):  Negative except as noted above  IMAGING: Nothing new to review.   ASSESSMENT/PLAN:  Brittany Morris is having increased incisional pain s/p above surgery with mild redness. No drainage. No fevers or chills.   Incision appears to be healing by secondary intention.   Treatment options reviewed with Dr. Clois and following plan made with patient:   - Keflex  sent to pharmacy- will do 500mg  tid x 7 days. She will take as directed.  - Continue on oxycodone  from pain management. Continue on prn robaxin  and neurontin .  - Reviewed wound care- will keep clean and dry.  - Will let us  know if redness gets worse, wound starts draining, or she  develops fever/chills. Red flags discussed at length.  - Will send her MyChart message next week so she can send another picture.  - Wound check scheduled on 05/04/24. She is not able to come in next week.   Advised to contact the office if any questions or concerns arise.  Glade Boys PA-C Department of neurosurgery

## 2024-04-23 NOTE — Telephone Encounter (Signed)
 Patient would like for someone to call her back with a response, her mychart is not working at this moment.

## 2024-04-29 DIAGNOSIS — M5126 Other intervertebral disc displacement, lumbar region: Secondary | ICD-10-CM

## 2024-04-29 DIAGNOSIS — Z9889 Other specified postprocedural states: Secondary | ICD-10-CM

## 2024-05-01 MED ORDER — FLUCONAZOLE 150 MG PO TABS: 150.0000 mg | ORAL_TABLET | Freq: Every day | ORAL | 0 refills | Status: AC

## 2024-05-01 NOTE — Telephone Encounter (Signed)
 Diflucan  sent to pharmacy. Possible reaction noted with zocor - discussed with pharmacist at [facility] pharmacy. No need to hold zocor  since diflucan  is one time dose.

## 2024-05-01 NOTE — Addendum Note (Signed)
 Addended byBETHA HILMA HASTINGS on: 05/01/2024 01:40 PM   Modules accepted: Orders

## 2024-05-04 ENCOUNTER — Ambulatory Visit: Admitting: Orthopedic Surgery

## 2024-05-04 NOTE — Telephone Encounter (Signed)
 Since she does not have hardware in her back, I would let him see her first to decide if he wants any additional imaging.

## 2024-05-05 ENCOUNTER — Encounter: Payer: Self-pay | Admitting: Neurosurgery

## 2024-05-05 ENCOUNTER — Ambulatory Visit (INDEPENDENT_AMBULATORY_CARE_PROVIDER_SITE_OTHER): Admitting: Neurosurgery

## 2024-05-05 DIAGNOSIS — M5126 Other intervertebral disc displacement, lumbar region: Secondary | ICD-10-CM

## 2024-05-05 DIAGNOSIS — M5416 Radiculopathy, lumbar region: Secondary | ICD-10-CM

## 2024-05-05 DIAGNOSIS — Z9889 Other specified postprocedural states: Secondary | ICD-10-CM

## 2024-05-05 MED ORDER — OXYCODONE HCL 10 MG PO TABS: 10.0000 mg | ORAL_TABLET | ORAL | 0 refills | Status: AC | PRN

## 2024-05-05 NOTE — Progress Notes (Signed)
   REFERRING PHYSICIAN:  [facility], [facility] [address] [place],  [place] [address]  DOS: 03/23/24  Left L4-L5 redo microdiscectomy and L3-L4 decompression  DOS: 10/07/19 L4-L5 hemilaminectomy and microdiscectomy  HISTORY OF PRESENT ILLNESS:  05/05/2024 Brittany Morris is here today after surgery approximately 6 weeks ago.  She is struggling.  She is still having back and leg pain.  She is back at work, but not doing well.  PHYSICAL EXAMINATION:  General: Patient is well developed, well nourished, calm, collected, and in no apparent distress.   NEUROLOGICAL:  General: In no acute distress.   Awake, alert, oriented to person, place, and time.  Pupils equal round and reactive to light.  Facial tone is symmetric.     Strength:            Side Iliopsoas Quads Hamstring PF DF EHL  R 5 5 5 5 5 5   L 4+ 4+ 5 5 4+ 5   Incision shows no evidence of erythema but still has some central scabbing.  ROS (Neurologic):  Negative except as noted above  IMAGING: Nothing new to review.   ASSESSMENT/PLAN:  Brittany Morris is doing fair after her lumbar decompression.  She continues to have back and leg pain.  We discussed using a barrier on her wound until the scab falls off.  I will refill her pain meds.  Will try to wean her off of these over time.  If she is still struggling at her next visit, we will obtain an MRI scan and a nerve conduction study.  I will send her for physical therapy.  Reeves Daisy MD Department of neurosurgery

## 2024-06-09 NOTE — Progress Notes (Deleted)
   REFERRING PHYSICIAN:  [facility], [facility] [address] [place],  [place] [address]  DOS: 03/23/24  Left L4-L5 redo microdiscectomy and L3-L4 decompression  DOS: 10/07/19 L4-L5 hemilaminectomy and microdiscectomy  HISTORY OF PRESENT ILLNESS:  She was still having back and leg pain at her last visit.    Consider lumbar MRI and EMG if no better***   She's had increased pain at her incision for last 5 days. Cannot let her pants touch it due to pain. No drainage noted. No fevers or chills.   Was doing well up until this point with minimal LBP. Her leg pain has improved.   She has a pain pump that she is still using. She is back on her chronic dose of prednisone . Pain management is prescribing her oxycodone . She is taking robaxin  and neurontin .    PHYSICAL EXAMINATION:  General: Patient is well developed, well nourished, calm, collected, and in no apparent distress.   NEUROLOGICAL:  General: In no acute distress.   Awake, alert, oriented to person, place, and time.  Pupils equal round and reactive to light.  Facial tone is symmetric.     Strength:          Side Iliopsoas Quads Hamstring PF DF EHL  R 5 5 5 5 5 5   L 5 5 5 5 5 5    Incision is well healed***   ROS (Neurologic):  Negative except as noted above  IMAGING: Nothing new to review.   ASSESSMENT/PLAN:  Brittany Morris is doing fair s/p above surgery.   Treatment options reviewed with patient and following plan made:   - Keflex  sent to pharmacy- will do 500mg  tid x 7 days. She will take as directed.  - Continue on oxycodone  from pain management. Continue on prn robaxin  and neurontin .  - Reviewed wound care- will keep clean and dry.  - Will let us  know if redness gets worse, wound starts draining, or she develops fever/chills. Red flags discussed at length.  - Will send her MyChart message next week so she can send another picture.  - Wound check scheduled on 05/04/24. She is not able to come in next  week.   Advised to contact the office if any questions or concerns arise.  Glade Boys PA-C Department of neurosurgery

## 2024-06-11 ENCOUNTER — Encounter: Admitting: Orthopedic Surgery

## 2024-06-19 NOTE — Progress Notes (Deleted)
   REFERRING PHYSICIAN:  [facility], [facility] [address] [place],  [place] [address]  DOS: 03/23/24  Left L4-L5 redo microdiscectomy and L3-L4 decompression  DOS: 10/07/19 L4-L5 hemilaminectomy and microdiscectomy  HISTORY OF PRESENT ILLNESS:  She was still having back and leg pain at her last visit.    Consider lumbar MRI and EMG if no better***   She's had increased pain at her incision for last 5 days. Cannot let her pants touch it due to pain. No drainage noted. No fevers or chills.   Was doing well up until this point with minimal LBP. Her leg pain has improved.   She has a pain pump that she is still using. She is back on her chronic dose of prednisone . Pain management is prescribing her oxycodone . She is taking robaxin  and neurontin .    PHYSICAL EXAMINATION:  General: Patient is well developed, well nourished, calm, collected, and in no apparent distress.   NEUROLOGICAL:  General: In no acute distress.   Awake, alert, oriented to person, place, and time.  Pupils equal round and reactive to light.  Facial tone is symmetric.     Strength:          Side Iliopsoas Quads Hamstring PF DF EHL  R 5 5 5 5 5 5   L 5 5 5 5 5 5    Incision is well healed***   ROS (Neurologic):  Negative except as noted above  IMAGING: Nothing new to review.   ASSESSMENT/PLAN:  SAUL DORSI is doing fair s/p above surgery.   Treatment options reviewed with patient and following plan made:   - Keflex  sent to pharmacy- will do 500mg  tid x 7 days. She will take as directed.  - Continue on oxycodone  from pain management. Continue on prn robaxin  and neurontin .  - Reviewed wound care- will keep clean and dry.  - Will let us  know if redness gets worse, wound starts draining, or she develops fever/chills. Red flags discussed at length.  - Will send her MyChart message next week so she can send another picture.  - Wound check scheduled on 05/04/24. She is not able to come in next  week.   Advised to contact the office if any questions or concerns arise.  Glade Boys PA-C Department of neurosurgery

## 2024-06-22 ENCOUNTER — Encounter: Admitting: Orthopedic Surgery

## 2024-07-24 ENCOUNTER — Encounter: Payer: Self-pay | Admitting: Orthopedic Surgery
